# Patient Record
Sex: Female | Born: 1965 | Race: Black or African American | Hispanic: No | Marital: Single | Smoking: Never smoker
Health system: Southern US, Community
[De-identification: ages and names within clinical notes are randomized; demographics above are authoritative.]

## PROBLEM LIST (undated history)

## (undated) DIAGNOSIS — F1910 Other psychoactive substance abuse, uncomplicated: Secondary | ICD-10-CM

## (undated) DIAGNOSIS — F319 Bipolar disorder, unspecified: Secondary | ICD-10-CM

## (undated) DIAGNOSIS — I10 Essential (primary) hypertension: Secondary | ICD-10-CM

## (undated) DIAGNOSIS — B1920 Unspecified viral hepatitis C without hepatic coma: Secondary | ICD-10-CM

## (undated) DIAGNOSIS — J449 Chronic obstructive pulmonary disease, unspecified: Secondary | ICD-10-CM

## (undated) HISTORY — PX: NO PAST SURGERIES: SHX2092

---

## 2005-02-07 ENCOUNTER — Inpatient Hospital Stay: Payer: Self-pay | Admitting: Unknown Physician Specialty

## 2006-03-12 ENCOUNTER — Emergency Department (HOSPITAL_COMMUNITY): Admission: EM | Admit: 2006-03-12 | Discharge: 2006-03-12 | Payer: Self-pay | Admitting: Emergency Medicine

## 2008-08-27 ENCOUNTER — Other Ambulatory Visit: Admission: RE | Admit: 2008-08-27 | Discharge: 2008-08-27 | Payer: Self-pay | Admitting: Obstetrics and Gynecology

## 2008-12-26 ENCOUNTER — Emergency Department (HOSPITAL_COMMUNITY): Admission: EM | Admit: 2008-12-26 | Discharge: 2008-12-26 | Payer: Self-pay | Admitting: Emergency Medicine

## 2009-04-30 ENCOUNTER — Emergency Department (HOSPITAL_COMMUNITY): Admission: EM | Admit: 2009-04-30 | Discharge: 2009-04-30 | Payer: Self-pay | Admitting: Emergency Medicine

## 2010-04-14 ENCOUNTER — Emergency Department (HOSPITAL_COMMUNITY)
Admission: EM | Admit: 2010-04-14 | Discharge: 2010-04-14 | Disposition: A | Discharge status: Home / Self Care | Payer: Medicaid Other | Attending: Emergency Medicine | Admitting: Emergency Medicine

## 2010-04-14 DIAGNOSIS — H109 Unspecified conjunctivitis: Secondary | ICD-10-CM | POA: Insufficient documentation

## 2010-04-14 DIAGNOSIS — H5789 Other specified disorders of eye and adnexa: Secondary | ICD-10-CM | POA: Insufficient documentation

## 2010-06-23 ENCOUNTER — Other Ambulatory Visit: Payer: Self-pay | Admitting: Obstetrics and Gynecology

## 2010-06-23 ENCOUNTER — Other Ambulatory Visit (HOSPITAL_COMMUNITY)
Admission: RE | Admit: 2010-06-23 | Discharge: 2010-06-23 | Disposition: A | Discharge status: Home / Self Care | Payer: Medicaid Other | Source: Ambulatory Visit | Attending: Obstetrics and Gynecology | Admitting: Obstetrics and Gynecology

## 2010-06-23 DIAGNOSIS — Z01419 Encounter for gynecological examination (general) (routine) without abnormal findings: Secondary | ICD-10-CM | POA: Insufficient documentation

## 2012-09-05 ENCOUNTER — Other Ambulatory Visit: Payer: Self-pay | Admitting: *Deleted

## 2012-09-07 MED ORDER — DILTIAZEM HCL ER COATED BEADS 180 MG PO CP24: 180.0000 mg | ORAL_CAPSULE | Freq: Every day | ORAL | Status: DC

## 2012-12-28 ENCOUNTER — Other Ambulatory Visit: Payer: Self-pay | Admitting: *Deleted

## 2012-12-31 MED ORDER — DILTIAZEM HCL ER COATED BEADS 180 MG PO CP24: 180.0000 mg | ORAL_CAPSULE | Freq: Every day | ORAL | Status: DC

## 2013-04-02 ENCOUNTER — Other Ambulatory Visit: Payer: Self-pay | Admitting: *Deleted

## 2013-04-16 ENCOUNTER — Other Ambulatory Visit: Payer: Self-pay | Admitting: *Deleted

## 2013-05-13 ENCOUNTER — Other Ambulatory Visit: Payer: Self-pay | Admitting: *Deleted

## 2013-05-14 ENCOUNTER — Other Ambulatory Visit: Payer: Self-pay | Admitting: *Deleted

## 2013-05-14 NOTE — Telephone Encounter (Signed)
Pt notified Dr. Emelda FearFerguson would not fill RX for Cardizem without an appt. Call transferred to front staff for an appt to be made.

## 2013-05-20 ENCOUNTER — Other Ambulatory Visit: Payer: Self-pay | Admitting: *Deleted

## 2013-05-20 MED ORDER — DILTIAZEM HCL ER COATED BEADS 180 MG PO TB24
180.0000 mg | ORAL_TABLET | Freq: Every day | ORAL | Status: DC
Start: 2013-05-20 — End: 2015-11-22

## 2013-05-20 NOTE — Telephone Encounter (Signed)
Pt advised that an appt needed to continue meds. She hasnt been seen in 3 + yrs.. Pt agrees to appt.

## 2013-06-17 ENCOUNTER — Other Ambulatory Visit: Payer: Self-pay | Admitting: *Deleted

## 2013-06-27 ENCOUNTER — Other Ambulatory Visit: Payer: Self-pay | Admitting: *Deleted

## 2013-06-28 NOTE — Telephone Encounter (Signed)
Pt informed Dr. Emelda FearFerguson would need a appointment and see Dr. Emelda FearFerguson before he would refill Cardizem request. Pt stated lived in [place] and was not able to make an appt at this time. Pt encouraged if she was unable to come to our office she might need to establish care with a provider closer to were she lives.

## 2013-06-30 ENCOUNTER — Emergency Department: Payer: Self-pay | Admitting: Emergency Medicine

## 2015-02-16 ENCOUNTER — Ambulatory Visit: Payer: Self-pay | Admitting: Family Medicine

## 2015-03-24 ENCOUNTER — Ambulatory Visit: Payer: Self-pay | Admitting: Family Medicine

## 2015-04-28 ENCOUNTER — Ambulatory Visit: Payer: Self-pay | Admitting: Family Medicine

## 2015-11-10 ENCOUNTER — Other Ambulatory Visit: Payer: Self-pay | Admitting: Nurse Practitioner

## 2015-11-10 DIAGNOSIS — Z1231 Encounter for screening mammogram for malignant neoplasm of breast: Secondary | ICD-10-CM

## 2015-11-19 ENCOUNTER — Encounter: Payer: Self-pay | Admitting: Emergency Medicine

## 2015-11-19 ENCOUNTER — Emergency Department
Admission: EM | Admit: 2015-11-19 | Discharge: 2015-11-19 | Disposition: A | Discharge status: Home / Self Care | Payer: Medicaid Other | Attending: Emergency Medicine | Admitting: Emergency Medicine

## 2015-11-19 ENCOUNTER — Emergency Department: Payer: Medicaid Other

## 2015-11-19 DIAGNOSIS — I10 Essential (primary) hypertension: Secondary | ICD-10-CM | POA: Insufficient documentation

## 2015-11-19 DIAGNOSIS — K858 Other acute pancreatitis without necrosis or infection: Secondary | ICD-10-CM | POA: Insufficient documentation

## 2015-11-19 DIAGNOSIS — R1013 Epigastric pain: Secondary | ICD-10-CM | POA: Diagnosis present

## 2015-11-19 DIAGNOSIS — J449 Chronic obstructive pulmonary disease, unspecified: Secondary | ICD-10-CM | POA: Insufficient documentation

## 2015-11-19 DIAGNOSIS — F1029 Alcohol dependence with unspecified alcohol-induced disorder: Secondary | ICD-10-CM | POA: Diagnosis not present

## 2015-11-19 HISTORY — DX: Chronic obstructive pulmonary disease, unspecified: J44.9

## 2015-11-19 LAB — COMPREHENSIVE METABOLIC PANEL
ALK PHOS: 115 U/L (ref 38–126)
ALT: 72 U/L — AB (ref 14–54)
AST: 67 U/L — AB (ref 15–41)
Albumin: 4 g/dL (ref 3.5–5.0)
Anion gap: 4 — ABNORMAL LOW (ref 5–15)
BILIRUBIN TOTAL: 0.4 mg/dL (ref 0.3–1.2)
BUN: 11 mg/dL (ref 6–20)
CALCIUM: 10.4 mg/dL — AB (ref 8.9–10.3)
CO2: 20 mmol/L — ABNORMAL LOW (ref 22–32)
CREATININE: 1.18 mg/dL — AB (ref 0.44–1.00)
Chloride: 107 mmol/L (ref 101–111)
GFR calc Af Amer: >60 mL/min (ref >60)
GFR calc non Af Amer: 52 mL/min — ABNORMAL LOW (ref >60)
GLUCOSE: 113 mg/dL — AB (ref 65–99)
Potassium: 3.9 mmol/L (ref 3.5–5.1)
SODIUM: 131 mmol/L — AB (ref 135–145)
TOTAL PROTEIN: 7.7 g/dL (ref 6.5–8.1)

## 2015-11-19 LAB — URINALYSIS COMPLETE WITH MICROSCOPIC (ARMC ONLY)
BILIRUBIN URINE: NEGATIVE
GLUCOSE, UA: NEGATIVE mg/dL
HGB URINE DIPSTICK: NEGATIVE
KETONES UR: NEGATIVE mg/dL
NITRITE: NEGATIVE
Protein, ur: NEGATIVE mg/dL
Specific Gravity, Urine: 1.008 (ref 1.005–1.030)
pH: 7 (ref 5.0–8.0)

## 2015-11-19 LAB — CBC
HEMATOCRIT: 43.2 % (ref 35.0–47.0)
Hemoglobin: 15.2 g/dL (ref 12.0–16.0)
MCH: 33 pg (ref 26.0–34.0)
MCHC: 35.1 g/dL (ref 32.0–36.0)
MCV: 94.1 fL (ref 80.0–100.0)
PLATELETS: 248 10*3/uL (ref 150–440)
RBC: 4.59 MIL/uL (ref 3.80–5.20)
RDW: 11.8 % (ref 11.5–14.5)
WBC: 8.9 10*3/uL (ref 3.6–11.0)

## 2015-11-19 LAB — LIPASE, BLOOD: LIPASE: 178 U/L — AB (ref 11–51)

## 2015-11-19 MED ORDER — ACETAMINOPHEN 325 MG PO TABS: 650.0000 mg | ORAL_TABLET | Freq: Once | ORAL | Status: DC

## 2015-11-19 MED ORDER — ONDANSETRON 4 MG PO TBDP: ORAL_TABLET | ORAL | 0 refills | Status: DC

## 2015-11-19 MED ORDER — ACETAMINOPHEN 500 MG PO TABS
ORAL_TABLET | ORAL | Status: AC
  Filled 2015-11-19: qty 2

## 2015-11-19 NOTE — Discharge Instructions (Signed)
As we discussed, we believe that your symptoms are the result of alcohol-induced pancreatitis although we cannot be absolutely sure.  You do have gallstones but it does not appear that the gallstones are the cause of the pancreatitis.  We offered to admission to the hospital but he declined at this time.  Please return to the emergency department if he develop new or worsening symptoms that concern you such as being unable to eat or drink.  Please discuss with your PCP and your ACT team the issue of your alcohol dependence and see if they have some additional resources for you.  Read through the included information about pancreatitis and try to avoid fatty, greasy, spicy foods as well as cutting down on your alcohol dependence.

## 2015-11-19 NOTE — ED Notes (Signed)
pts family requested tylenol, ns went to give pt tylenol and pt states she "dont like tylenol it makes me sick" and refused.

## 2015-11-19 NOTE — ED Notes (Signed)
See triage note  Nasal congestion  Cough with some fever for about 1- 2 weeks .Marland Kitchen. Was started on Bactrim DS for URI  And sx's became worse.. Diarrhea  Feeling weak.

## 2015-11-19 NOTE — ED Triage Notes (Signed)
Patient presents to the ED with centralized abdominal pain, diarrhea x 5 or more, and vomiting x 2 today with nausea and vomiting with di.  Patient reports nasal congestion and cough x 1-2 weeks.  Patient reports occasional episodes of shakiness or dizziness.  Patient is alert and oriented x 4 at this time.

## 2015-11-19 NOTE — ED Provider Notes (Signed)
[facility] Emergency Department Provider Note  ____________________________________________   First MD Initiated Contact with Patient 11/19/15 1550     (approximate)  I have reviewed the triage vital signs and the nursing notes.   HISTORY  Chief Complaint Emesis; Diarrhea; Abdominal Pain; Nasal Congestion; and Fever    HPI Laurie Mckay is a 50 y.o. female whose medical history includes psychiatric illness and alcohol dependence who presents for evaluation of about 4 weeks of intermittent epigastric pain with occasional episodes of nausea and vomiting.  She states that it is no worse but it is no better and that is why she came to the emergency department today.  She has an outpatient primary care doctor as well as an ACT teamthat sees her regularly for her to health.  She reports that she was able to eat breakfast this morning and keep it down without any difficulty but still has some persistent nausea.  She reports the symptoms are at times severe but currently mild.  She has been compliant with all her regular medications.  She reports that she drinks several 40-ounce beers a day and has been increasing recently.  Nothing particular makes her symptoms better nor worse.   Past Medical History:  Diagnosis Date  . COPD (chronic obstructive pulmonary disease) (HCC)     There are no active problems to display for this patient.   History reviewed. No pertinent surgical history.  Prior to Admission medications   Medication Sig Start Date End Date Taking? Authorizing Provider  diltiazem (CARDIZEM CD) 180 MG 24 hr capsule Take 1 capsule (180 mg total) by mouth daily. 12/28/12   Tilda Burrow, MD  diltiazem (CARDIZEM LA) 180 MG 24 hr tablet Take 1 tablet (180 mg total) by mouth daily. 05/20/13   Tilda Burrow, MD  ondansetron (ZOFRAN ODT) 4 MG disintegrating tablet Allow 1-2 tablets to dissolve in your mouth every 8 hours as needed for nausea/vomiting  11/19/15   Loleta Rose, MD    Allergies Review of patient's allergies indicates no known allergies.  No family history on file.  Social History Social History  Substance Use Topics  . Smoking status: Never Smoker  . Smokeless tobacco: Never Used  . Alcohol use Yes     Comment: 4- 30's daily    Review of Systems Constitutional: No fever/chills Eyes: No visual changes. ENT: No sore throat. Cardiovascular: Denies chest pain. Respiratory: Denies shortness of breath. Gastrointestinal: +epigastric pain , +N/V Genitourinary: Negative for dysuria. Musculoskeletal: Negative for back pain. Skin: Negative for rash. Neurological: Negative for headaches, focal weakness or numbness.  10-point ROS otherwise negative.  ____________________________________________   PHYSICAL EXAM:  VITAL SIGNS: ED Triage Vitals  Enc Vitals Group     BP 11/19/15 1409 124/87     Pulse Rate 11/19/15 1409 78     Resp 11/19/15 1409 20     Temp 11/19/15 1409 100.1 F (37.8 C)     Temp Source 11/19/15 1409 Oral     SpO2 11/19/15 1409 97 %     Weight 11/19/15 1410 156 lb (70.8 kg)     Height 11/19/15 1410 5\' 2"  (1.575 m)     Head Circumference --      Peak Flow --      Pain Score --      Pain Loc --      Pain Edu? --      Excl. in GC? --     Constitutional: Alert and oriented.  Well appearing and in no acute distress. Eyes: Conjunctivae are normal. PERRL. EOMI. Head: Atraumatic. Nose: No congestion/rhinnorhea. Mouth/Throat: Mucous membranes are moist.  Oropharynx non-erythematous. Neck: No stridor.  No meningeal signs.   Cardiovascular: Normal rate, regular rhythm. Good peripheral circulation. Grossly normal heart sounds. Respiratory: Normal respiratory effort.  No retractions. Lungs CTAB. Gastrointestinal: Soft with mild TTP of the epigastrium. Musculoskeletal: No lower extremity tenderness nor edema. No gross deformities of extremities. Neurologic:  Normal speech and language. No gross focal  neurologic deficits are appreciated.  Skin:  Skin is warm, dry and intact. No rash noted. Psychiatric: Mood and affect are normal. Speech and behavior are normal.  ____________________________________________   LABS (all labs ordered are listed, but only abnormal results are displayed)  Labs Reviewed  LIPASE, BLOOD - Abnormal; Notable for the following:       Result Value   Lipase 178 (*)    All other components within normal limits  COMPREHENSIVE METABOLIC PANEL - Abnormal; Notable for the following:    Sodium 131 (*)    CO2 20 (*)    Glucose, Bld 113 (*)    Creatinine, Ser 1.18 (*)    Calcium 10.4 (*)    AST 67 (*)    ALT 72 (*)    GFR calc non Af Amer 52 (*)    Anion gap 4 (*)    All other components within normal limits  CBC  URINALYSIS COMPLETEWITH MICROSCOPIC ([facility] ONLY)   ____________________________________________  EKG  None - EKG not ordered by ED physician ____________________________________________  RADIOLOGY   [place] Abdomen Limited Ruq  Result Date: 11/19/2015 CLINICAL DATA:  RIGHT upper quadrant epigastric pain for 2 weeks, pancreatitis EXAM: US ABDOMEN LIMITED - RIGHT UPPER QUADRANT COMPARISON:  Non FINDINGS: Gallbladder: Shadowing calculi in gallbladder. Largest measured calculus 16 mm diameter. No gallbladder wall thickening, pericholecystic fluid, or sonographic Murphy sign. Common bile duct: Diameter: 2 mm diameter , normal Liver: Echogenic, likely fatty infiltration, though this can be seen with cirrhosis and certain infiltrative disorders. No focal hepatic mass or nodularity. Hepatopetal portal venous flow. No RIGHT upper quadrant free fluid IMPRESSION: Probable fatty infiltration of liver as above. Cholelithiasis without definite evidence of acute cholecystitis. Electronically Signed   By: Ulyses SouthwardMark  Boles M.D.   On: 11/19/2015 17:08    ____________________________________________   PROCEDURES  Procedure(s) performed:   Procedures   Critical Care  performed: No ____________________________________________   INITIAL IMPRESSION / ASSESSMENT AND PLAN / ED COURSE  Pertinent labs & imaging results that were available during my care of the patient were reviewed by me and considered in my medical decision making (see chart for details).  The patient's findings are consistent with mild pancreatitis given her elevated lipase.  I offered admission but she very much does not want to stay in the hospital and her female companion agrees.  She wants follow-up as an outpatient.  I obtained an ultrasound to make sure she has not have evidence of choledocholithiasis or cholecystitis and even though she does have some gallstones just does not seem to be "gallbladder pancreatitis".  I had my usual and customary pancreatitis and alcoholism discussion with patient and she will follow up closely as an outpatient.  I gave strict return precautions.   ____________________________________________  FINAL CLINICAL IMPRESSION(S) / ED DIAGNOSES  Final diagnoses:  Other pancreatitis  Alcohol dependence with unspecified alcohol-induced disorder (HCC)     MEDICATIONS GIVEN DURING THIS VISIT:  Medications - No data to display   NEW  OUTPATIENT MEDICATIONS STARTED DURING THIS VISIT:  New Prescriptions   ONDANSETRON (ZOFRAN ODT) 4 MG DISINTEGRATING TABLET    Allow 1-2 tablets to dissolve in your mouth every 8 hours as needed for nausea/vomiting    Modified Medications   No medications on file    Discontinued Medications   No medications on file     Note:  This document was prepared using Dragon voice recognition software and may include unintentional dictation errors.    Loleta Rose, MD 11/19/15 1730

## 2015-11-22 ENCOUNTER — Inpatient Hospital Stay
Admission: EM | Admit: 2015-11-22 | Discharge: 2015-11-25 | DRG: 917 | Disposition: A | Discharge status: Home / Self Care | Payer: Medicaid Other | Attending: Internal Medicine | Admitting: Internal Medicine

## 2015-11-22 ENCOUNTER — Emergency Department: Payer: Medicaid Other

## 2015-11-22 DIAGNOSIS — B1920 Unspecified viral hepatitis C without hepatic coma: Secondary | ICD-10-CM | POA: Diagnosis present

## 2015-11-22 DIAGNOSIS — T405X1A Poisoning by cocaine, accidental (unintentional), initial encounter: Principal | ICD-10-CM | POA: Diagnosis present

## 2015-11-22 DIAGNOSIS — B9620 Unspecified Escherichia coli [E. coli] as the cause of diseases classified elsewhere: Secondary | ICD-10-CM | POA: Diagnosis present

## 2015-11-22 DIAGNOSIS — R4701 Aphasia: Secondary | ICD-10-CM | POA: Diagnosis present

## 2015-11-22 DIAGNOSIS — Y92009 Unspecified place in unspecified non-institutional (private) residence as the place of occurrence of the external cause: Secondary | ICD-10-CM

## 2015-11-22 DIAGNOSIS — F1010 Alcohol abuse, uncomplicated: Secondary | ICD-10-CM | POA: Diagnosis present

## 2015-11-22 DIAGNOSIS — Z8249 Family history of ischemic heart disease and other diseases of the circulatory system: Secondary | ICD-10-CM

## 2015-11-22 DIAGNOSIS — K859 Acute pancreatitis without necrosis or infection, unspecified: Secondary | ICD-10-CM | POA: Diagnosis present

## 2015-11-22 DIAGNOSIS — G92 Toxic encephalopathy: Secondary | ICD-10-CM | POA: Diagnosis present

## 2015-11-22 DIAGNOSIS — F17200 Nicotine dependence, unspecified, uncomplicated: Secondary | ICD-10-CM | POA: Diagnosis present

## 2015-11-22 DIAGNOSIS — R1013 Epigastric pain: Secondary | ICD-10-CM | POA: Diagnosis present

## 2015-11-22 DIAGNOSIS — F319 Bipolar disorder, unspecified: Secondary | ICD-10-CM | POA: Diagnosis present

## 2015-11-22 DIAGNOSIS — G9340 Encephalopathy, unspecified: Secondary | ICD-10-CM | POA: Diagnosis present

## 2015-11-22 DIAGNOSIS — K861 Other chronic pancreatitis: Secondary | ICD-10-CM

## 2015-11-22 DIAGNOSIS — J449 Chronic obstructive pulmonary disease, unspecified: Secondary | ICD-10-CM | POA: Diagnosis present

## 2015-11-22 DIAGNOSIS — N390 Urinary tract infection, site not specified: Secondary | ICD-10-CM | POA: Diagnosis present

## 2015-11-22 DIAGNOSIS — F1410 Cocaine abuse, uncomplicated: Secondary | ICD-10-CM | POA: Diagnosis present

## 2015-11-22 DIAGNOSIS — R109 Unspecified abdominal pain: Secondary | ICD-10-CM

## 2015-11-22 DIAGNOSIS — I10 Essential (primary) hypertension: Secondary | ICD-10-CM | POA: Diagnosis present

## 2015-11-22 DIAGNOSIS — F1910 Other psychoactive substance abuse, uncomplicated: Secondary | ICD-10-CM | POA: Diagnosis present

## 2015-11-22 DIAGNOSIS — R7889 Finding of other specified substances, not normally found in blood: Secondary | ICD-10-CM | POA: Diagnosis present

## 2015-11-22 HISTORY — DX: Other psychoactive substance abuse, uncomplicated: F19.10

## 2015-11-22 HISTORY — DX: Unspecified viral hepatitis C without hepatic coma: B19.20

## 2015-11-22 HISTORY — DX: Essential (primary) hypertension: I10

## 2015-11-22 HISTORY — DX: Bipolar disorder, unspecified: F31.9

## 2015-11-22 LAB — CBC
HCT: 43.6 % (ref 35.0–47.0)
Hemoglobin: 14.7 g/dL (ref 12.0–16.0)
MCH: 32.2 pg (ref 26.0–34.0)
MCHC: 33.8 g/dL (ref 32.0–36.0)
MCV: 95.3 fL (ref 80.0–100.0)
PLATELETS: 243 10*3/uL (ref 150–440)
RBC: 4.57 MIL/uL (ref 3.80–5.20)
RDW: 11.9 % (ref 11.5–14.5)
WBC: 11.8 10*3/uL — AB (ref 3.6–11.0)

## 2015-11-22 LAB — URINALYSIS COMPLETE WITH MICROSCOPIC (ARMC ONLY)
BILIRUBIN URINE: NEGATIVE
GLUCOSE, UA: NEGATIVE mg/dL
Ketones, ur: NEGATIVE mg/dL
Nitrite: NEGATIVE
Protein, ur: 30 mg/dL — AB
Specific Gravity, Urine: 1.009 (ref 1.005–1.030)
pH: 7 (ref 5.0–8.0)

## 2015-11-22 LAB — URINE DRUG SCREEN, QUALITATIVE (ARMC ONLY)
AMPHETAMINES, UR SCREEN: NOT DETECTED
Barbiturates, Ur Screen: NOT DETECTED
Benzodiazepine, Ur Scrn: NOT DETECTED
CANNABINOID 50 NG, UR ~~LOC~~: NOT DETECTED
COCAINE METABOLITE, UR ~~LOC~~: POSITIVE — AB
MDMA (ECSTASY) UR SCREEN: NOT DETECTED
METHADONE SCREEN, URINE: NOT DETECTED
Opiate, Ur Screen: NOT DETECTED
Phencyclidine (PCP) Ur S: NOT DETECTED
TRICYCLIC, UR SCREEN: NOT DETECTED

## 2015-11-22 LAB — COMPREHENSIVE METABOLIC PANEL
ALK PHOS: 110 U/L (ref 38–126)
ALT: 66 U/L — AB (ref 14–54)
AST: 52 U/L — AB (ref 15–41)
Albumin: 4.2 g/dL (ref 3.5–5.0)
Anion gap: 5 (ref 5–15)
BILIRUBIN TOTAL: 0.7 mg/dL (ref 0.3–1.2)
BUN: 12 mg/dL (ref 6–20)
CHLORIDE: 106 mmol/L (ref 101–111)
CO2: 20 mmol/L — ABNORMAL LOW (ref 22–32)
CREATININE: 1.04 mg/dL — AB (ref 0.44–1.00)
Calcium: 9.4 mg/dL (ref 8.9–10.3)
GFR calc Af Amer: >60 mL/min (ref >60)
Glucose, Bld: 124 mg/dL — ABNORMAL HIGH (ref 65–99)
Potassium: 4 mmol/L (ref 3.5–5.1)
Sodium: 131 mmol/L — ABNORMAL LOW (ref 135–145)
TOTAL PROTEIN: 8 g/dL (ref 6.5–8.1)

## 2015-11-22 LAB — ETHANOL

## 2015-11-22 LAB — ACETAMINOPHEN LEVEL: Acetaminophen (Tylenol), Serum: <10 ug/mL — ABNORMAL LOW (ref 10–30)

## 2015-11-22 LAB — AMMONIA: Ammonia: 42 umol/L — ABNORMAL HIGH (ref 9–35)

## 2015-11-22 LAB — SALICYLATE LEVEL: Salicylate Lvl: <4 mg/dL (ref 2.8–30.0)

## 2015-11-22 LAB — LIPASE, BLOOD: Lipase: 198 U/L — ABNORMAL HIGH (ref 11–51)

## 2015-11-22 MED ORDER — DEXTROSE 5 % IV SOLN
1.0000 g | Freq: Once | INTRAVENOUS | Status: AC
  Administered 2015-11-22: 1 g via INTRAVENOUS
  Filled 2015-11-22: qty 10

## 2015-11-22 NOTE — ED Triage Notes (Signed)
Patient reports that was seen here 3 days ago and diagnosed with pancreatitis and ED wanted to admit patient, but she refused.  Patient returns today due to slurred speech, abdominal pain and wanting to be admitted for her pancreatitis.

## 2015-11-22 NOTE — ED Notes (Signed)
At end triage patient stiffened up, shook for 2-3 seconds but was easily aroused with verbal stimuli.  Patient remained in wheelchair.  Patient reports she has not had any alcohol in the past 4 days.  States that she has never had detox before.

## 2015-11-22 NOTE — ED Provider Notes (Addendum)
[facility] Emergency Department Provider Note  ____________________________________________   I have reviewed the triage vital signs and the nursing notes.   HISTORY  Chief Complaint Abdominal Pain and Aphasia    HPI Laurie Mckay is a 50 y.o. female who presents today with multiple different complaints. Patient was seen here and diagnosed with pancreatitis recently but went home after declining further admission. Since going home she has not been vomiting she has not had abdominal pain she states. However, the family states that she's been acting bizarrely. Patient does have a history of bipolar disorder and they feel that her bipolar disorders acting up. In addition, patient is a daily drinker has not had any alcohol in 4 days. Patient was laughing inappropriately and acting strangely at home. She has not had any fever since her isn't here. She denies headache or stiff neck. Patient does admit to cocaine abuse in the family is aware of this they state that she has been having ongoing cocaine but it doesn't sound as if she she is in the last few days to the extent that anyone can determine. She has not had a seizure, she's been somewhat sleepy recently but is unclear if this is because of her cocaine abuse or the sleeping medication that she takes. There's been no overdose or SI or HI.   Past Medical History:  Diagnosis Date  . COPD (chronic obstructive pulmonary disease) (HCC)     There are no active problems to display for this patient.   No past surgical history on file.  Prior to Admission medications   Medication Sig Start Date End Date Taking? Authorizing Provider  diltiazem (CARDIZEM CD) 180 MG 24 hr capsule Take 1 capsule (180 mg total) by mouth daily. Patient not taking: Reported on 11/22/2015 12/28/12   Tilda Burrow, MD  ondansetron (ZOFRAN ODT) 4 MG disintegrating tablet Allow 1-2 tablets to dissolve in your mouth every 8 hours as needed for  nausea/vomiting Patient not taking: Reported on 11/22/2015 11/19/15   Loleta Rose, MD    Allergies Review of patient's allergies indicates no known allergies.  No family history on file.  Social History Social History  Substance Use Topics  . Smoking status: Never Smoker  . Smokeless tobacco: Never Used  . Alcohol use Yes     Comment: 4- 30's daily    Review of Systems Constitutional: No fever/chills Eyes: No visual changes. ENT: No sore throat. No stiff neck no neck pain Cardiovascular: Denies chest pain. Respiratory: Denies shortness of breath. Gastrointestinal:   no vomiting.  No diarrhea.  No constipation. Genitourinary: Negative for dysuria. Musculoskeletal: Negative lower extremity swelling Skin: Negative for rash. Neurological: Negative for severe headaches, focal weakness or numbness. 10-point ROS otherwise negative.  ____________________________________________   PHYSICAL EXAM:  VITAL SIGNS: ED Triage Vitals  Enc Vitals Group     BP 11/22/15 1916 (!) 166/91     Pulse Rate 11/22/15 1916 78     Resp 11/22/15 1916 18     Temp 11/22/15 1916 99.3 F (37.4 C)     Temp Source 11/22/15 1916 Oral     SpO2 11/22/15 1916 98 %     Weight --      Height --      Head Circumference --      Peak Flow --      Pain Score 11/22/15 1917 9     Pain Loc --      Pain Edu? --  Excl. in GC? --     Constitutional: Alert and orientedTo name and place and unsure of the date. Patient is somnolent but easily arousable to verbal stimuli. She is in no acute medical distress.. Eyes: Conjunctivae are normal. PERRL. EOMI. Head: Atraumatic. Nose: No congestion/rhinnorhea. Mouth/Throat: Mucous membranes are moist.  Oropharynx non-erythematous. Neck: No stridor.   Nontender with no meningismus Cardiovascular: Normal rate, regular rhythm. Grossly normal heart sounds.  Good peripheral circulation. Respiratory: Normal respiratory effort.  No retractions. Lungs CTAB. Abdominal: Soft  and nontender. No distention. No guarding no rebound Back:  There is no focal tenderness or step off.  there is no midline tenderness there are no lesions noted. there is no CVA tenderness Musculoskeletal: No lower extremity tenderness, no upper extremity tenderness. No joint effusions, no DVT signs strong distal pulses no edema Neurologic:  Normal speech and language. No gross focal neurologic deficits are appreciated.  Skin:  Skin is warm, dry and intact. No rash noted. Psychiatric: Mood and affect are somewhat bizarre. Speech and behavior are normal.  ____________________________________________   LABS (all labs ordered are listed, but only abnormal results are displayed)  Labs Reviewed  LIPASE, BLOOD - Abnormal; Notable for the following:       Result Value   Lipase 198 (*)    All other components within normal limits  COMPREHENSIVE METABOLIC PANEL - Abnormal; Notable for the following:    Sodium 131 (*)    CO2 20 (*)    Glucose, Bld 124 (*)    Creatinine, Ser 1.04 (*)    AST 52 (*)    ALT 66 (*)    All other components within normal limits  CBC - Abnormal; Notable for the following:    WBC 11.8 (*)    All other components within normal limits  URINALYSIS COMPLETEWITH MICROSCOPIC ([facility] ONLY) - Abnormal; Notable for the following:    Color, Urine YELLOW (*)    APPearance CLOUDY (*)    Hgb urine dipstick 1+ (*)    Protein, ur 30 (*)    Leukocytes, UA 3+ (*)    Bacteria, UA FEW (*)    Squamous Epithelial / LPF 6-30 (*)    All other components within normal limits  AMMONIA - Abnormal; Notable for the following:    Ammonia 42 (*)    All other components within normal limits  URINE DRUG SCREEN, QUALITATIVE ([facility] ONLY) - Abnormal; Notable for the following:    Cocaine Metabolite,Ur  POSITIVE (*)    All other components within normal limits  ACETAMINOPHEN LEVEL - Abnormal; Notable for the following:    Acetaminophen (Tylenol), Serum <10 (*)    All other components within  normal limits  URINE CULTURE  SALICYLATE LEVEL  ETHANOL  LITHIUM LEVEL   ____________________________________________  EKG  I personally interpreted any EKGs ordered by me or triage Late entry ----------------------------------------- 10:57 PM on 12/03/2015 ----------------------------------------- Sinus rhythm 79 bpm normal axis no acute ST elevation or depression LVH noted   ____________________________________________  RADIOLOGY  I reviewed any imaging ordered by me or triage that were performed during my shift and, if possible, patient and/or family made aware of any abnormal findings. ____________________________________________   PROCEDURES  Procedure(s) performed: None  Procedures  Critical Care performed: None  ____________________________________________   INITIAL IMPRESSION / ASSESSMENT AND PLAN / ED COURSE  Pertinent labs & imaging results that were available during my care of the patient were reviewed by me and considered in my medical decision making (see chart  for details).  Abdomen is benign, somewhat bizarre activity. Likely some accommodation of cocaine abuse, alcohol abuse, there is certainly some possibly hepatic encephalopathy with a mildly elevated ammonia and she has urinary tract infection. All this will be too much stress to clear out as an outpatient. We'll give her antibiotics for her urinary tract infection, we will hold off on lactulose pending further assessment as there is some degree of hemolysis, and I will admit her to the hospital for further evaluation. I discussed with the hospitalist and they agree with this plan. Abdomen is benign at this time, there is no evidence of gallstone pancreatitis on prior ultrasound done a few days ago and given the fact that her belly is benign and will forego further imaging. 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 One thing I do not see any evidence of his acute alcohol withdrawal, patient does not have any evidence of tachycardia or tremor. While certainly possible she has some subacute withdrawal symptoms, there is no evidence of unopposed alcohol withdrawal clinically. I'm reluctant to give her benzodiazepines as she is artery sleepy, we'll continue to observe her. There has been no known seizure activity.  Clinical Course   ____________________________________________   FINAL CLINICAL IMPRESSION(S) / ED DIAGNOSES  Final diagnoses:  None      This chart was dictated using voice recognition software.  Despite best efforts to proofread,  errors can occur which can change meaning.      Jeanmarie Plant, MD 11/22/15 1610    Jeanmarie Plant, MD 12/03/15 2258

## 2015-11-23 ENCOUNTER — Encounter: Payer: Self-pay | Admitting: Internal Medicine

## 2015-11-23 DIAGNOSIS — I10 Essential (primary) hypertension: Secondary | ICD-10-CM | POA: Diagnosis present

## 2015-11-23 DIAGNOSIS — Z8249 Family history of ischemic heart disease and other diseases of the circulatory system: Secondary | ICD-10-CM | POA: Diagnosis not present

## 2015-11-23 DIAGNOSIS — N390 Urinary tract infection, site not specified: Secondary | ICD-10-CM | POA: Diagnosis not present

## 2015-11-23 DIAGNOSIS — K859 Acute pancreatitis without necrosis or infection, unspecified: Secondary | ICD-10-CM | POA: Diagnosis present

## 2015-11-23 DIAGNOSIS — R7889 Finding of other specified substances, not normally found in blood: Secondary | ICD-10-CM | POA: Diagnosis present

## 2015-11-23 DIAGNOSIS — B1920 Unspecified viral hepatitis C without hepatic coma: Secondary | ICD-10-CM | POA: Diagnosis present

## 2015-11-23 DIAGNOSIS — F1410 Cocaine abuse, uncomplicated: Secondary | ICD-10-CM | POA: Diagnosis present

## 2015-11-23 DIAGNOSIS — R4701 Aphasia: Secondary | ICD-10-CM | POA: Diagnosis present

## 2015-11-23 DIAGNOSIS — G9340 Encephalopathy, unspecified: Secondary | ICD-10-CM | POA: Diagnosis present

## 2015-11-23 DIAGNOSIS — F319 Bipolar disorder, unspecified: Secondary | ICD-10-CM | POA: Diagnosis present

## 2015-11-23 DIAGNOSIS — R1013 Epigastric pain: Secondary | ICD-10-CM | POA: Diagnosis present

## 2015-11-23 DIAGNOSIS — F17200 Nicotine dependence, unspecified, uncomplicated: Secondary | ICD-10-CM | POA: Diagnosis present

## 2015-11-23 DIAGNOSIS — F1010 Alcohol abuse, uncomplicated: Secondary | ICD-10-CM | POA: Diagnosis present

## 2015-11-23 DIAGNOSIS — G92 Toxic encephalopathy: Secondary | ICD-10-CM | POA: Diagnosis present

## 2015-11-23 DIAGNOSIS — K861 Other chronic pancreatitis: Secondary | ICD-10-CM

## 2015-11-23 DIAGNOSIS — B9620 Unspecified Escherichia coli [E. coli] as the cause of diseases classified elsewhere: Secondary | ICD-10-CM | POA: Diagnosis present

## 2015-11-23 DIAGNOSIS — Y92009 Unspecified place in unspecified non-institutional (private) residence as the place of occurrence of the external cause: Secondary | ICD-10-CM | POA: Diagnosis not present

## 2015-11-23 DIAGNOSIS — J449 Chronic obstructive pulmonary disease, unspecified: Secondary | ICD-10-CM | POA: Diagnosis present

## 2015-11-23 DIAGNOSIS — T405X1A Poisoning by cocaine, accidental (unintentional), initial encounter: Secondary | ICD-10-CM | POA: Diagnosis not present

## 2015-11-23 DIAGNOSIS — F1910 Other psychoactive substance abuse, uncomplicated: Secondary | ICD-10-CM | POA: Diagnosis present

## 2015-11-23 LAB — CBC
HCT: 39.6 % (ref 35.0–47.0)
Hemoglobin: 13.9 g/dL (ref 12.0–16.0)
MCH: 33.2 pg (ref 26.0–34.0)
MCHC: 35 g/dL (ref 32.0–36.0)
MCV: 94.7 fL (ref 80.0–100.0)
PLATELETS: 224 10*3/uL (ref 150–440)
RBC: 4.18 MIL/uL (ref 3.80–5.20)
RDW: 11.7 % (ref 11.5–14.5)
WBC: 12.1 10*3/uL — AB (ref 3.6–11.0)

## 2015-11-23 LAB — BASIC METABOLIC PANEL
Anion gap: 3 — ABNORMAL LOW (ref 5–15)
BUN: 11 mg/dL (ref 6–20)
CHLORIDE: 108 mmol/L (ref 101–111)
CO2: 22 mmol/L (ref 22–32)
CREATININE: 0.85 mg/dL (ref 0.44–1.00)
Calcium: 9.1 mg/dL (ref 8.9–10.3)
GFR calc non Af Amer: >60 mL/min (ref >60)
GLUCOSE: 133 mg/dL — AB (ref 65–99)
Potassium: 3.2 mmol/L — ABNORMAL LOW (ref 3.5–5.1)
Sodium: 133 mmol/L — ABNORMAL LOW (ref 135–145)

## 2015-11-23 LAB — PHOSPHORUS: Phosphorus: 2.7 mg/dL (ref 2.5–4.6)

## 2015-11-23 LAB — MAGNESIUM: Magnesium: 1.8 mg/dL (ref 1.7–2.4)

## 2015-11-23 LAB — LITHIUM LEVEL: LITHIUM LVL: 2.98 mmol/L — AB (ref 0.60–1.20)

## 2015-11-23 MED ORDER — ONDANSETRON HCL 4 MG/2ML IJ SOLN: 4.0000 mg | Freq: Four times a day (QID) | INTRAMUSCULAR | Status: DC | PRN

## 2015-11-23 MED ORDER — FOLIC ACID 1 MG PO TABS
1.0000 mg | ORAL_TABLET | Freq: Every day | ORAL | Status: DC
  Administered 2015-11-23 – 2015-11-25 (×3): 1 mg via ORAL
  Filled 2015-11-23 (×3): qty 1

## 2015-11-23 MED ORDER — ALBUTEROL SULFATE (2.5 MG/3ML) 0.083% IN NEBU: 3.0000 mL | INHALATION_SOLUTION | RESPIRATORY_TRACT | Status: DC | PRN

## 2015-11-23 MED ORDER — SODIUM CHLORIDE 0.9% FLUSH
3.0000 mL | Freq: Two times a day (BID) | INTRAVENOUS | Status: DC
  Administered 2015-11-24: 3 mL via INTRAVENOUS

## 2015-11-23 MED ORDER — SODIUM CHLORIDE 0.9 % IV SOLN
INTRAVENOUS | Status: AC
  Administered 2015-11-23: 02:00:00 via INTRAVENOUS

## 2015-11-23 MED ORDER — ONDANSETRON HCL 4 MG PO TABS: 4.0000 mg | ORAL_TABLET | Freq: Four times a day (QID) | ORAL | Status: DC | PRN

## 2015-11-23 MED ORDER — LORAZEPAM 2 MG PO TABS: 0.0000 mg | ORAL_TABLET | Freq: Two times a day (BID) | ORAL | Status: DC

## 2015-11-23 MED ORDER — LORAZEPAM 1 MG PO TABS: 1.0000 mg | ORAL_TABLET | Freq: Four times a day (QID) | ORAL | Status: DC | PRN

## 2015-11-23 MED ORDER — LORAZEPAM 2 MG PO TABS
0.0000 mg | ORAL_TABLET | Freq: Four times a day (QID) | ORAL | Status: AC
  Administered 2015-11-23: 2 mg via ORAL
  Filled 2015-11-23: qty 2

## 2015-11-23 MED ORDER — ENOXAPARIN SODIUM 40 MG/0.4ML ~~LOC~~ SOLN
40.0000 mg | SUBCUTANEOUS | Status: DC
  Administered 2015-11-23 – 2015-11-24 (×2): 40 mg via SUBCUTANEOUS
  Filled 2015-11-23 (×2): qty 0.4

## 2015-11-23 MED ORDER — ADULT MULTIVITAMIN W/MINERALS CH
1.0000 | ORAL_TABLET | Freq: Every day | ORAL | Status: DC
  Administered 2015-11-23 – 2015-11-25 (×3): 1 via ORAL
  Filled 2015-11-23 (×3): qty 1

## 2015-11-23 MED ORDER — VITAMIN B-1 100 MG PO TABS
100.0000 mg | ORAL_TABLET | Freq: Every day | ORAL | Status: DC
  Administered 2015-11-23 – 2015-11-25 (×2): 100 mg via ORAL
  Filled 2015-11-23 (×2): qty 1

## 2015-11-23 MED ORDER — MORPHINE SULFATE (PF) 2 MG/ML IV SOLN
2.0000 mg | INTRAVENOUS | Status: DC | PRN
  Administered 2015-11-23 (×2): 2 mg via INTRAVENOUS
  Filled 2015-11-23 (×2): qty 1

## 2015-11-23 MED ORDER — THIAMINE HCL 100 MG/ML IJ SOLN
100.0000 mg | Freq: Every day | INTRAMUSCULAR | Status: DC
  Filled 2015-11-23: qty 2

## 2015-11-23 MED ORDER — DEXTROSE 5 % IV SOLN
2.0000 g | INTRAVENOUS | Status: DC
  Administered 2015-11-23 – 2015-11-25 (×3): 2 g via INTRAVENOUS
  Filled 2015-11-23 (×3): qty 2

## 2015-11-23 MED ORDER — LORAZEPAM 2 MG/ML IJ SOLN
1.0000 mg | Freq: Four times a day (QID) | INTRAMUSCULAR | Status: DC | PRN
  Administered 2015-11-24: 1 mg via INTRAVENOUS
  Filled 2015-11-23: qty 1

## 2015-11-23 NOTE — Care Management (Signed)
Admitted from home where she lives with her spouse with c/o abdominal pain, n/v and aphasia. Cocaine positive. HX of ETOH abuse. CIWA protocol. Found to have a UTI. PCP is Vincent GrosHeather Boscia, NP. She is followed by the ACT team regularly. She has Medicaid. Will monitor chart for discharge needs but have not identified needs at this time.

## 2015-11-23 NOTE — Progress Notes (Signed)
[facility] - [place] at [facility]   PATIENT NAME: Laurie Mckay    MRN#:  [id number]  DATE OF BIRTH:  03/30/1965  SUBJECTIVE:  Hospital Day: 0 days Laurie Mckay is a 50 y.o. female presenting with Abdominal Pain and Aphasia .   Overnight events: No acute overnight events, patient was confused Interval Events: Still somewhat confused this morning, family at bedside, patient various complaints most of them nonmedical nature  REVIEW OF SYSTEMS:  CONSTITUTIONAL: No fever, fatigue or weakness.  EYES: No blurred or double vision.  EARS, NOSE, AND THROAT: No tinnitus or ear pain.  RESPIRATORY: No cough, shortness of breath, wheezing or hemoptysis.  CARDIOVASCULAR: No chest pain, orthopnea, edema.  GASTROINTESTINAL: No nausea, vomiting, diarrhea or abdominal pain.  GENITOURINARY: No dysuria, hematuria.  ENDOCRINE: No polyuria, nocturia,  HEMATOLOGY: No anemia, easy bruising or bleeding SKIN: No rash or lesion. MUSCULOSKELETAL: No joint pain or arthritis.   NEUROLOGIC: No tingling, numbness, weakness.  PSYCHIATRY: No anxiety or depression.   DRUG ALLERGIES:  No Known Allergies  VITALS:  Blood pressure 100/61, pulse 63, temperature 98.1 F (36.7 C), temperature source Oral, resp. rate 16, height 5\' 2"  (1.575 m), weight 73 kg (161 lb), SpO2 100 %.  PHYSICAL EXAMINATION:  VITAL SIGNS: Vitals:   11/23/15 0933 11/23/15 1214  BP: 138/82 100/61  Pulse: 73 63  Resp: 16 16  Temp: 98.4 F (36.9 C) 98.1 F (36.7 C)   GENERAL:50 y.o.female currently in no acute distress.  HEAD: Normocephalic, atraumatic.  EYES: Pupils equal, round, reactive to light. Extraocular muscles intact. No scleral icterus.  MOUTH: Moist mucosal membrane. Dentition intact. No abscess noted.  EAR, NOSE, THROAT: Clear without exudates. No external lesions.  NECK: Supple. No thyromegaly. No nodules. No JVD.  PULMONARY: Clear to ascultation, without wheeze rails or rhonci. No use of  accessory muscles, Good respiratory effort. good air entry bilaterally CHEST: Nontender to palpation.  CARDIOVASCULAR: S1 and S2. Regular rate and rhythm. No murmurs, rubs, or gallops. No edema. Pedal pulses 2+ bilaterally.  GASTROINTESTINAL: Soft, nontender, nondistended. No masses. Positive bowel sounds. No hepatosplenomegaly.  MUSCULOSKELETAL: No swelling, clubbing, or edema. Range of motion full in all extremities.  NEUROLOGIC: Cranial nerves II through XII are intact. No gross focal neurological deficits. Sensation intact. Reflexes intact.  SKIN: No ulceration, lesions, rashes, or cyanosis. Skin warm and dry. Turgor intact.  PSYCHIATRIC: Mood, affect agitated The patient is awake, alert and oriented x 2. Insight, judgment poor.      LABORATORY PANEL:   CBC  Recent Labs Lab 11/23/15 0317  WBC 12.1*  HGB 13.9  HCT 39.6  PLT 224   ------------------------------------------------------------------------------------------------------------------  Chemistries   Recent Labs Lab 11/22/15 1939 11/23/15 0317  NA 131* 133*  K 4.0 3.2*  CL 106 108  CO2 20* 22  GLUCOSE 124* 133*  BUN 12 11  CREATININE 1.04* 0.85  CALCIUM 9.4 9.1  MG 1.8  --   AST 52*  --   ALT 66*  --   ALKPHOS 110  --   BILITOT 0.7  --    ------------------------------------------------------------------------------------------------------------------  Cardiac Enzymes No results for input(s): TROPONINI in the last 168 hours. ------------------------------------------------------------------------------------------------------------------  RADIOLOGY:  Ct Head Wo Contrast  Result Date: 11/22/2015 CLINICAL DATA:  Altered mental status with slurred speech and headache. EXAM: CT HEAD WITHOUT CONTRAST TECHNIQUE: Contiguous axial images were obtained from the base of the skull through the vertex without intravenous contrast. COMPARISON:  None. FINDINGS: Brain: No evidence of  acute infarction, hemorrhage,  hydrocephalus, extra-axial collection or mass lesion/mass effect. Vascular: No hyperdense vessel or unexpected calcification. Skull: Normal. Negative for fracture or focal lesion. Sinuses/Orbits: No acute finding. Other: None. IMPRESSION: Negative exam. Electronically Signed   By: Marnee SpringJonathon  Watts M.D.   On: 11/22/2015 20:33    EKG:   Orders placed or performed during the hospital encounter of 11/22/15  . ED EKG  . ED EKG  . EKG 12-Lead  . EKG 12-Lead    ASSESSMENT AND PLAN:   Jetty PeeksCarol Silverthorn is a 50 y.o. female presenting with Abdominal Pain and Aphasia . Admitted 11/22/2015 : Day #: 0 days  1.Metabolic encephalopathy: Multifactorial including drug abuse, UTI 2. Alcohol abuse: CIWA protocol 3. Urinary tract infection: Ceftriaxone and follow culture data and 4. Bipolar: Lithium, lithium level elevated hold for now 5. Pancreatitis: Premedications IV fluids and advance diet as tolerated-patient was seen in emergency department-refused admission but came back to the hospital for the above   All the records are reviewed and case discussed with Care Management/Social Workerr. Management plans discussed with the patient, family and they are in agreement.  CODE STATUS: full TOTAL TIME TAKING CARE OF THIS PATIENT: 28 minutes.   POSSIBLE D/C IN 2-3DAYS, DEPENDING ON CLINICAL CONDITION.   Hower,  Mardi MainlandDavid K M.D on 11/23/2015 at 12:57 PM  Between 7am to 6pm - Pager - [phone]  After 6pm: House Pager: - [phone]  [facility]  Office  [phone]  CC: Primary care physician; Carlean JewsHeather E Boscia, NP

## 2015-11-23 NOTE — Clinical Social Work Note (Signed)
Clinical Social Work Assessment  Patient Details  Name: Laurie Mckay MRN: [id number] Date of Birth: Nov 08, 1965  Date of referral:  11/23/15               Reason for consult:  Substance Use/ETOH Abuse                Permission sought to share information with:    Permission granted to share information::     Name::        Agency::     Relationship::     Contact Information:     Housing/Transportation Living arrangements for the past 2 months:  Single Family Home Source of Information:  Other (Comment Required) (Significant other ) Patient Interpreter Needed:  None Criminal Activity/Legal Involvement Pertinent to Current Situation/Hospitalization:  No - Comment as needed Significant Relationships:  Significant Other Lives with:  Significant Other Do you feel safe going back to the place where you live?    Need for family participation in patient care:  Yes (Comment)  Care giving concerns:  Patient lives in [place] with her [place].    Social Worker assessment / plan:  Holiday representative ([place]) reviewed chart and noted that patient has a history of ETOH abuse. CSW met with patient and her fiance Laurie Mckay was at bedside. Patient was asleep during assessment. Per Derry him and patient live in [place]. Derry reported that patient quit drinking alcohol around 5 days ago and is going through withdrawal. Per Derry patient has been doing better and walked independently to the bathroom today. Per Derry patient has no children and he is her primary support. Per Derry patient has depression and bipolar and is followed by an ACTT Team. Laurie Mckay reported that he can provide 24/7 care for patient. Derry reported no concerns at this time. CSW will continue to follow and assist as needed.  Employment status:  Disabled (Comment on whether or not currently receiving Disability) Insurance information:  Medicaid In [place] PT Recommendations:  Not assessed at this time Information / Referral to  community resources:     Patient/Family's Response to care:  Patient's fiance Derry reported no concerns at this time.   Patient/Family's Understanding of and Emotional Response to Diagnosis, Current Treatment, and Prognosis:  Fiance Laurie Mckay was very pleasant and thanked CSW for visit.   Emotional Assessment Appearance:  Appears stated age Attitude/Demeanor/Rapport:  Unable to Assess Affect (typically observed):  Unable to Assess Orientation:  Oriented to Self, Oriented to Place, Oriented to  Time, Oriented to Situation Alcohol / Substance use:  Alcohol Use Psych involvement (Current and /or in the community):  No (Comment)  Discharge Needs  Concerns to be addressed:  Discharge Planning Concerns Readmission within the last 30 days:  No Current discharge risk:  Chronically ill, Substance Abuse Barriers to Discharge:  Continued Medical Work up   [employer], Veronia Beets, LCSW 11/23/2015, 4:18 PM

## 2015-11-23 NOTE — Progress Notes (Signed)
Patient alert but forgetful. Inappropriate laughter during conversation. Unsteady gait with muscle weakness.Frequent commands giving during ambulation to patient . Cardiac mointoring intact. Running SR1HB. IV fluids infusing. Expressed concerns of lithium lab value to MD Sheryle Hailiamond. No new orders given. Nursing will continue to monitor.

## 2015-11-23 NOTE — H&P (Signed)
[place] at [place] NAME: Laurie Mckay    MR#:  [id number]  DATE OF BIRTH:  [birth year]  DATE OF ADMISSION:  11/22/2015  PRIMARY CARE PHYSICIAN: Ronnell Freshwater, NP   REQUESTING/REFERRING PHYSICIAN: Burlene Arnt, MD  CHIEF COMPLAINT:   Chief Complaint  Patient presents with  . Abdominal Pain  . Aphasia    HISTORY OF PRESENT ILLNESS:  Laurie Mckay  is a 50 y.o. female who presents with A myriad of complaints.  She has a history of chronic pancreatitis, and comes in complaining of epigastric abdominal pain. She also complains of some dysuria. Family states that she has been acting confused. In the ED she was found to have a lipase of 198 and a UTI. She was also found to be cocaine positive on her urine tox screen. She has a history of significant alcohol use, but states she has not had anything to drink past few days. Hospitals were called for admission and further treatment  PAST MEDICAL HISTORY:   Past Medical History:  Diagnosis Date  . Bipolar 1 disorder ([place])   . COPD (chronic obstructive pulmonary disease) ([place])   . Hepatitis C   . HTN (hypertension)   . Substance abuse     PAST SURGICAL HISTORY:   Past Surgical History:  Procedure Laterality Date  . NO PAST SURGERIES      SOCIAL HISTORY:   Social History  Substance Use Topics  . Smoking status: Never Smoker  . Smokeless tobacco: Never Used  . Alcohol use Yes     Comment: 4- 30's daily    FAMILY HISTORY:   Family History  Problem Relation Age of Onset  . Hypertension Mother     DRUG ALLERGIES:  No Known Allergies  MEDICATIONS AT HOME:   Prior to Admission medications   Medication Sig Start Date End Date Taking? Authorizing Provider  diltiazem (CARDIZEM CD) 180 MG 24 hr capsule Take 1 capsule (180 mg total) by mouth daily. Patient not taking: Reported on 11/22/2015 12/28/12   Jonnie Kind, MD  ondansetron (ZOFRAN ODT) 4 MG disintegrating tablet  Allow 1-2 tablets to dissolve in your mouth every 8 hours as needed for nausea/vomiting Patient not taking: Reported on 11/22/2015 11/19/15   Hinda Kehr, MD    REVIEW OF SYSTEMS:  Review of Systems  Constitutional: Negative for chills, fever, malaise/fatigue and weight loss.  HENT: Negative for ear pain, hearing loss and tinnitus.   Eyes: Negative for blurred vision, double vision, pain and redness.  Respiratory: Negative for cough, hemoptysis and shortness of breath.   Cardiovascular: Negative for chest pain, palpitations, orthopnea and leg swelling.  Gastrointestinal: Positive for abdominal pain and nausea. Negative for constipation, diarrhea and vomiting.  Genitourinary: Positive for dysuria. Negative for frequency and hematuria.  Musculoskeletal: Negative for back pain, joint pain and neck pain.  Skin:       No acne, rash, or lesions  Neurological: Negative for dizziness, tremors, focal weakness and weakness.  Endo/Heme/Allergies: Negative for polydipsia. Does not bruise/bleed easily.  Psychiatric/Behavioral: Negative for depression. The patient is not nervous/anxious and does not have insomnia.      VITAL SIGNS:   Vitals:   11/22/15 2017 11/22/15 2100 11/22/15 2144 11/22/15 2252  BP: (!) 159/88 (!) 148/87 (!) 147/96 (!) 188/92  Pulse: 82 74 83 86  Resp: _0 (!) 22  Temp:      TempSrc:      SpO2: 100% 100% 100%  100%   Wt Readings from Last 3 Encounters:  11/19/15 70.8 kg (156 lb)    PHYSICAL EXAMINATION:  Physical Exam  Vitals reviewed. Constitutional: She is oriented to person, place, and time. She appears well-developed and well-nourished. No distress.  HENT:  Head: Normocephalic and atraumatic.  Mouth/Throat: Oropharynx is clear and moist.  Eyes: Conjunctivae and EOM are normal. Pupils are equal, round, and reactive to light. No scleral icterus.  Neck: Normal range of motion. Neck supple. No JVD present. No thyromegaly present.  Cardiovascular: Normal rate,  regular rhythm and intact distal pulses.  Exam reveals no gallop and no friction rub.   No murmur heard. Respiratory: Effort normal and breath sounds normal. No respiratory distress. She has no wheezes. She has no rales.  GI: Soft. Bowel sounds are normal. She exhibits no distension. There is tenderness.  Musculoskeletal: Normal range of motion. She exhibits no edema.  No arthritis, no gout  Lymphadenopathy:    She has no cervical adenopathy.  Neurological: She is alert and oriented to person, place, and time. No cranial nerve deficit.  No dysarthria, no aphasia  Skin: Skin is warm and dry. No rash noted. No erythema.  Psychiatric: She has a normal mood and affect. Her behavior is normal. Judgment and thought content normal.    LABORATORY PANEL:   CBC  Recent Labs Lab 11/22/15 1939  WBC 11.8*  HGB 14.7  HCT 43.6  PLT 243   ------------------------------------------------------------------------------------------------------------------  Chemistries   Recent Labs Lab 11/22/15 1939  NA 131*  K 4.0  CL 106  CO2 20*  GLUCOSE 124*  BUN 12  CREATININE 1.04*  CALCIUM 9.4  AST 52*  ALT 66*  ALKPHOS 110  BILITOT 0.7   ------------------------------------------------------------------------------------------------------------------  Cardiac Enzymes No results for input(s): TROPONINI in the last 168 hours. ------------------------------------------------------------------------------------------------------------------  RADIOLOGY:  Ct Head Wo Contrast  Result Date: 11/22/2015 CLINICAL DATA:  Altered mental status with slurred speech and headache. EXAM: CT HEAD WITHOUT CONTRAST TECHNIQUE: Contiguous axial images were obtained from the base of the skull through the vertex without intravenous contrast. COMPARISON:  None. FINDINGS: Brain: No evidence of acute infarction, hemorrhage, hydrocephalus, extra-axial collection or mass lesion/mass effect. Vascular: No hyperdense  vessel or unexpected calcification. Skull: Normal. Negative for fracture or focal lesion. Sinuses/Orbits: No acute finding. Other: None. IMPRESSION: Negative exam. Electronically Signed   By: Monte Fantasia M.D.   On: 11/22/2015 20:33    EKG:   Orders placed or performed during the hospital encounter of 11/22/15  . ED EKG  . ED EKG  . EKG 12-Lead  . EKG 12-Lead    IMPRESSION AND PLAN:  Principal Problem:   Acute encephalopathy - likely due to multiple factors, possibly withdrawal, UTI cocaine use. Expect this will improve with treatment of the same. CT head was normal. Active Problems:   UTI (lower urinary tract infection) - IV antibiotics started in the ED and continued on admission, cultures sent from the ED.   Substance abuse - cocaine positive on her urine drug screen today, significant alcohol use though she has not had anything to drink for the past couple of days. CIWA protocol ordered   HTN (hypertension) - currently stable, continue home meds   COPD (chronic obstructive pulmonary disease) ([place]) - continue home inhaler   Bipolar 1 disorder ([place]) - patient's family states that she takes lithium, this is not the standard on her home meds. Either way checking a lithium level.  All the records are reviewed and case  discussed with ED provider. Management plans discussed with the patient and/or family.  DVT PROPHYLAXIS: SubQ lovenox  GI PROPHYLAXIS: None  ADMISSION STATUS: Inpatient  CODE STATUS: Full Code Status History    This patient does not have a recorded code status. Please follow your organizational policy for patients in this situation.      TOTAL TIME TAKING CARE OF THIS PATIENT: 45 minutes.    Wrenna Saks Cunningham 11/23/2015, 12:51 AM  [facility]  Office  [phone]  CC: Primary care physician; Ronnell Freshwater, NP

## 2015-11-23 NOTE — Progress Notes (Signed)
Pharmacy Antibiotic Note  Toma DeitersCarol D Arizpe is a 50 y.o. female admitted on 11/22/2015 with UTI.  Pharmacy has been consulted for ceftriaxone dosing.  Plan: Ceftriaxone 2 grams q 24 hours ordered.  Height: 5\' 2"  (157.5 cm) Weight: 161 lb (73 kg) IBW/kg (Calculated) : 50.1  Temp (24hrs), Avg:98.9 F (37.2 C), Min:98.4 F (36.9 C), Max:99.3 F (37.4 C)   Recent Labs Lab 11/19/15 1416 11/22/15 1939  WBC 8.9 11.8*  CREATININE 1.18* 1.04*    Estimated Creatinine Clearance: 59.9 mL/min (by C-G formula based on SCr of 1.04 mg/dL).    No Known Allergies  Antimicrobials this admission: ceftriaxone  >>    >>   Dose adjustments this admission:   Microbiology results: 9/10 UCx: pending    9/10 UA: LE(+) NO2(-) WBC TNTC  Thank you for allowing pharmacy to be a part of this patient's care.  Lason Eveland S 11/23/2015 2:33 AM

## 2015-11-23 NOTE — ED Notes (Signed)
Meal tray given to patient.

## 2015-11-24 ENCOUNTER — Inpatient Hospital Stay: Payer: Medicaid Other

## 2015-11-24 ENCOUNTER — Encounter: Payer: Self-pay | Admitting: Radiology

## 2015-11-24 LAB — BASIC METABOLIC PANEL
Anion gap: 4 — ABNORMAL LOW (ref 5–15)
BUN: 8 mg/dL (ref 6–20)
CALCIUM: 8.7 mg/dL — AB (ref 8.9–10.3)
CO2: 20 mmol/L — AB (ref 22–32)
Chloride: 111 mmol/L (ref 101–111)
Creatinine, Ser: 0.79 mg/dL (ref 0.44–1.00)
GFR calc Af Amer: >60 mL/min (ref >60)
GLUCOSE: 110 mg/dL — AB (ref 65–99)
Potassium: 3.6 mmol/L (ref 3.5–5.1)
Sodium: 135 mmol/L (ref 135–145)

## 2015-11-24 LAB — MAGNESIUM: Magnesium: 2.1 mg/dL (ref 1.7–2.4)

## 2015-11-24 LAB — LITHIUM LEVEL
LITHIUM LVL: 2.47 mmol/L — AB (ref 0.60–1.20)
Lithium Lvl: 2.22 mmol/L (ref 0.60–1.20)

## 2015-11-24 LAB — AMMONIA: AMMONIA: 35 umol/L (ref 9–35)

## 2015-11-24 MED ORDER — BUTALBITAL-APAP-CAFFEINE 50-325-40 MG PO TABS: 1.0000 | ORAL_TABLET | Freq: Once | ORAL | Status: DC

## 2015-11-24 MED ORDER — TRAMADOL HCL 50 MG PO TABS: 50.0000 mg | ORAL_TABLET | Freq: Four times a day (QID) | ORAL | Status: DC | PRN

## 2015-11-24 MED ORDER — IOPAMIDOL (ISOVUE-300) INJECTION 61%
15.0000 mL | INTRAVENOUS | Status: AC
  Administered 2015-11-24 (×2): 15 mL via ORAL

## 2015-11-24 MED ORDER — CEFUROXIME AXETIL 500 MG PO TABS: 500.0000 mg | ORAL_TABLET | Freq: Two times a day (BID) | ORAL | 0 refills | Status: DC

## 2015-11-24 MED ORDER — ALUM & MAG HYDROXIDE-SIMETH 200-200-20 MG/5ML PO SUSP
30.0000 mL | ORAL | Status: DC | PRN
  Administered 2015-11-24: 30 mL via ORAL
  Filled 2015-11-24: qty 30

## 2015-11-24 MED ORDER — TRAMADOL HCL 50 MG PO TABS: 50.0000 mg | ORAL_TABLET | Freq: Once | ORAL | Status: DC

## 2015-11-24 MED ORDER — SODIUM CHLORIDE 0.9 % IV SOLN: INTRAVENOUS | Status: AC

## 2015-11-24 MED ORDER — IOPAMIDOL (ISOVUE-300) INJECTION 61%
100.0000 mL | Freq: Once | INTRAVENOUS | Status: AC | PRN
  Administered 2015-11-24: 100 mL via INTRAVENOUS

## 2015-11-24 NOTE — Care Management (Signed)
Met with patient at bedside. She states her boyfriend lives with her and cares for her. He is with her 24/7. She is agreeable to a walker. Ordered from [facility]. No needs for home health identified. She does not qualify for PT with Medicaid. No additional needs identified. DC pending today.

## 2015-11-24 NOTE — Progress Notes (Signed)
[facility] - [place] at [facility]   PATIENT NAME: Laurie Mckay    MR#:  [id number]  DATE OF BIRTH:  Mar 22, 1965  SUBJECTIVE:  Plan for discharge but patient with abdominal pain after eating  REVIEW OF SYSTEMS:    Review of Systems  Constitutional: Negative.  Negative for chills, fever and malaise/fatigue.  HENT: Negative.  Negative for ear discharge, ear pain, hearing loss, nosebleeds and sore throat.   Eyes: Negative.  Negative for blurred vision and pain.  Respiratory: Negative.  Negative for cough, hemoptysis, shortness of breath and wheezing.   Cardiovascular: Negative.  Negative for chest pain, palpitations and leg swelling.  Gastrointestinal: Positive for abdominal pain. Negative for blood in stool, diarrhea, nausea and vomiting.  Genitourinary: Negative.  Negative for dysuria.  Musculoskeletal: Negative.  Negative for back pain.  Skin: Negative.   Neurological: Negative for dizziness, tremors, speech change, focal weakness, seizures and headaches.  Endo/Heme/Allergies: Negative.  Does not bruise/bleed easily.  Psychiatric/Behavioral: Negative.  Negative for depression, hallucinations and suicidal ideas.    Tolerating Diet:np      DRUG ALLERGIES:  No Known Allergies  VITALS:  Blood pressure (!) 156/86, pulse 82, temperature 98.4 F (36.9 C), temperature source Oral, resp. rate 16, height 5\' 2"  (1.575 m), weight 73 kg (161 lb), SpO2 98 %.  PHYSICAL EXAMINATION:   Physical Exam  Constitutional: She is oriented to person, place, and time and well-developed, well-nourished, and in no distress. No distress.  HENT:  Head: Normocephalic.  Eyes: No scleral icterus.  Neck: Normal range of motion. Neck supple. No JVD present. No tracheal deviation present.  Cardiovascular: Normal rate, regular rhythm and normal heart sounds.  Exam reveals no gallop and no friction rub.   No murmur heard. Pulmonary/Chest: Effort normal and breath sounds normal. No  respiratory distress. She has no wheezes. She has no rales. She exhibits no tenderness.  Abdominal: Soft. Bowel sounds are normal. She exhibits no distension and no mass. There is no tenderness. There is no rebound and no guarding.  Musculoskeletal: Normal range of motion. She exhibits no edema.  Neurological: She is alert and oriented to person, place, and time.  Skin: Skin is warm. No rash noted. No erythema.  Psychiatric: Affect and judgment normal.      LABORATORY PANEL:   CBC  Recent Labs Lab 11/23/15 0317  WBC 12.1*  HGB 13.9  HCT 39.6  PLT 224   ------------------------------------------------------------------------------------------------------------------  Chemistries   Recent Labs Lab 11/22/15 1939  11/24/15 0814  NA 131*  < > 135  K 4.0  < > 3.6  CL 106  < > 111  CO2 20*  < > 20*  GLUCOSE 124*  < > 110*  BUN 12  < > 8  CREATININE 1.04*  < > 0.79  CALCIUM 9.4  < > 8.7*  MG 1.8  --  2.1  AST 52*  --   --   ALT 66*  --   --   ALKPHOS 110  --   --   BILITOT 0.7  --   --   < > = values in this interval not displayed. ------------------------------------------------------------------------------------------------------------------  Cardiac Enzymes No results for input(s): TROPONINI in the last 168 hours. ------------------------------------------------------------------------------------------------------------------  RADIOLOGY:  Dg Abd 1 View  Result Date: 11/24/2015 CLINICAL DATA:  History of epigastric abdominal pain, dysuria. History of chronic pancreatitis, substance abuse. Known gallstones EXAM: ABDOMEN - 1 VIEW COMPARISON:  Abdominal ultrasound of November 19, 2015 FINDINGS: The bowel gas  pattern is normal. There is moderate gaseous distention of the stomach. No abnormal soft tissue calcifications are observed. The lung bases are clear. The bony structures exhibit no acute abnormality. IMPRESSION: Mild gaseous distention of the stomach. Otherwise  unremarkable supine abdominal radiograph. Electronically Signed   By: David  [place] M.D.   On: 11/24/2015 09:55   Ct Head Wo Contrast  Result Date: 11/22/2015 CLINICAL DATA:  Altered mental status with slurred speech and headache. EXAM: CT HEAD WITHOUT CONTRAST TECHNIQUE: Contiguous axial images were obtained from the base of the skull through the vertex without intravenous contrast. COMPARISON:  None. FINDINGS: Brain: No evidence of acute infarction, hemorrhage, hydrocephalus, extra-axial collection or mass lesion/mass effect. Vascular: No hyperdense vessel or unexpected calcification. Skull: Normal. Negative for fracture or focal lesion. Sinuses/Orbits: No acute finding. Other: None. IMPRESSION: Negative exam. Electronically Signed   By: Marnee Spring M.D.   On: 11/22/2015 20:33     ASSESSMENT AND PLAN:   50 year old female with a history of EtOH abuse who presented with metabolic encephalopathy due to EtOH and UTI.  1. Metabolic encephalopathy: This is due to drug abuse and UTI. Her mental status is not baseline.  2. EtOH abuse: Patient was on CIWA protocol without any eventful detox. She is encouraged to remain abstinent.  3. Urinary tract infection: Patient was started on ceftriaxone. She was afebrile. She will be discharged on Ceftin. At the time of discharge urine culture is pending.  3. History of bipolar 1 disorder: Due to elevated lithium level this has been discontinued for now. [facility] Psych consult.   4. Tobacco dependence: Patient is encouraged to stop smoking. She was counseled for 3 minutes at the time of discharge to stop smoking. She will continue nicotine patch.  5. History of hypertension: Patient is on Minipress 6. Pancreatitis: still with abdominal pain withnegative KUB Order ct abdomen and NPO     Management plans discussed with the patient's mother is in agreement.  CODE STATUS: full  TOTAL TIME TAKING CARE OF THIS PATIENT: 30 minutes.     POSSIBLE  D/C tomorrow, DEPENDING ON CLINICAL CONDITION.   Dontavious Emily M.D on 11/24/2015 at 1:15 PM  Between 7am to 6pm - Pager - [phone] After 6pm go to [email or web address] - [email or web address]  [facility] [place] [facility]  Office  [phone]  CC: Primary care physician; Carlean Jews, NP  Note: This dictation was prepared with Dragon dictation along with smaller phrase technology. Any transcriptional errors that result from this process are unintentional.

## 2015-11-24 NOTE — Discharge Summary (Addendum)
[employer] [facility] [place] [facility]   PATIENT NAME: Laurie PeeksCarol Mckay    MR#:  [id number]  DATE OF BIRTH:  [birth year]  DATE OF ADMISSION:  11/22/2015 ADMITTING PHYSICIAN: Oralia Manisavid Willis, MD  DATE OF DISCHARGE: 11/25/2015  PRIMARY CARE PHYSICIAN: Carlean JewsHeather E Boscia, NP    ADMISSION DIAGNOSIS:  UTI (lower urinary tract infection) [N39.0] Acute pancreatitis, unspecified pancreatitis type [K85.9]  DISCHARGE DIAGNOSIS:  Principal Problem:   Acute encephalopathy Active Problems:   UTI (lower urinary tract infection)   HTN (hypertension)   COPD (chronic obstructive pulmonary disease) (HCC)   Bipolar 1 disorder (HCC)   Substance abuse   Acute on chronic pancreatitis (HCC)   SECONDARY DIAGNOSIS:   Past Medical History:  Diagnosis Date  . Bipolar 1 disorder (HCC)   . COPD (chronic obstructive pulmonary disease) (HCC)   . Hepatitis C   . HTN (hypertension)   . Substance abuse     HOSPITAL COURSE:   50 year old female with a history of EtOH abuse who presented with metabolic encephalopathy due to EtOH and UTI.  1. Metabolic encephalopathy: This is due to drug abuse ( Positive urine toxicology on admission for cocaine) and Escherichia coli UTI. Her mental status is at baseline.  2. EtOH abuse: Patient was on CIWA protocol without any eventful detox. She is encouraged to remain abstinent.  3. Escherichia coli Urinary tract infection: Patient was started on ceftriaxone. Escherichia coli urine culture was sensitive to ceftriaxone sensitive therefore she will be discharged on Ceftin.   3. History of bipolar 1 disorder: Due to elevated lithium level this has been discontinued for now. She may restart this likely on Friday. She will have follow-up with her primary care physician on Friday for lithium level check and if this is within normal limits she may resume lithium. She will continue other outpatient occasions.  4. Tobacco dependence: Patient is encouraged to stop  smoking. She was counseled for 3 minutes at the time of discharge to stop smoking. She will continue nicotine patch.  5. History of hypertension: Patient is on Minipress.  6. Pancreatitis: CT scan confirms mild pancreatitis. Patient was treated with IV fluids and nothing by mouth status. Her abdominal pain improved and she was able to tolerate her diet.  DISCHARGE CONDITIONS AND DIET:   Stable for discharge on regular diet  CONSULTS OBTAINED:  Treatment Team:  Adrian SaranSital Sloan Takagi, MD  DRUG ALLERGIES:  No Known Allergies  DISCHARGE MEDICATIONS:   Current Discharge Medication List    START taking these medications   Details  cefUROXime (CEFTIN) 500 MG tablet Take 1 tablet (500 mg total) by mouth 2 (two) times daily with a meal. Qty: 12 tablet, Refills: 0      CONTINUE these medications which have NOT CHANGED   Details  albuterol (PROVENTIL HFA;VENTOLIN HFA) 108 (90 Base) MCG/ACT inhaler Inhale 1-2 puffs into the lungs 3 (three) times daily as needed for wheezing or shortness of breath.    benztropine (COGENTIN) 1 MG tablet Take 1 mg by mouth daily as needed for tremors.    Cholecalciferol (VITAMIN D3) 2000 units capsule Take 2,000 Units by mouth daily.    gabapentin (NEURONTIN) 300 MG capsule Take 1,200 mg by mouth at bedtime.    hydrOXYzine (VISTARIL) 50 MG capsule Take 50 mg by mouth at bedtime.    Melatonin 5 MG TABS Take 5-10 mg by mouth at bedtime as needed. For insomnia    nicotine (NICODERM CQ - DOSED IN MG/24 HOURS) 21 mg/24hr patch  Place 21 mg onto the skin daily.    paliperidone (INVEGA) 3 MG 24 hr tablet Take 3 mg by mouth every morning.    Paliperidone Palmitate (INVEGA TRINZA) 819 MG/2.625ML SUSP Inject 819 mg into the muscle every 3 (three) months.    prazosin (MINIPRESS) 1 MG capsule Take 1 mg by mouth every morning.      STOP taking these medications     lithium carbonate (ESKALITH) 450 MG CR tablet      lithium carbonate (LITHOBID) 300 MG CR tablet                Today   CHIEF COMPLAINT:   Patient is at baseline. Neysa Bonito is at bedside who takes care of the patient 24/7   VITAL SIGNS:  Blood pressure (!) 150/60, pulse 74, temperature 98.3 F (36.8 C), temperature source Oral, resp. rate 19, height 5\' 2"  (1.575 m), weight 73 kg (161 lb), SpO2 100 %.   REVIEW OF SYSTEMS:  Review of Systems  Constitutional: Negative for chills, fever and malaise/fatigue.  HENT: Negative.  Negative for ear discharge, ear pain, hearing loss, nosebleeds and sore throat.   Eyes: Negative.  Negative for blurred vision and pain.  Respiratory: Negative.  Negative for cough, hemoptysis, shortness of breath and wheezing.   Cardiovascular: Negative.  Negative for chest pain, palpitations and leg swelling.  Gastrointestinal: Negative.  Negative for abdominal pain, blood in stool, diarrhea, nausea and vomiting.  Genitourinary: Negative.  Negative for dysuria.  Musculoskeletal: Negative.  Negative for back pain.  Skin: Negative.   Neurological: Positive for weakness. Negative for dizziness, tremors, speech change, focal weakness, seizures and headaches.  Endo/Heme/Allergies: Negative.  Does not bruise/bleed easily.  Psychiatric/Behavioral: Negative.  Negative for depression, hallucinations and suicidal ideas.     PHYSICAL EXAMINATION:  GENERAL:  50 y.o.-year-old patient lying in the bed with no acute distress.  NECK:  Supple, no jugular venous distention. No thyroid enlargement, no tenderness.  LUNGS: Normal breath sounds bilaterally, no wheezing, rales,rhonchi  No use of accessory muscles of respiration.  CARDIOVASCULAR: S1, S2 normal. No murmurs, rubs, or gallops.  ABDOMEN: Soft, non-tender, non-distended. Bowel sounds present. No organomegaly or mass.  EXTREMITIES: No pedal edema, cyanosis, or clubbing.  PSYCHIATRIC: The patient is alert and oriented x 3. Slow in her speech, poor judgment SKIN: No obvious rash, lesion, or ulcer.   DATA REVIEW:    CBC  Recent Labs Lab 11/23/15 0317  WBC 12.1*  HGB 13.9  HCT 39.6  PLT 224    Chemistries   Recent Labs Lab 11/22/15 1939  11/24/15 0814  11/25/15 0821  NA 131*  < > 135  < > 136  K 4.0  < > 3.6  < > 3.4*  CL 106  < > 111  < > 110  CO2 20*  < > 20*  < > 21*  GLUCOSE 124*  < > 110*  < > 131*  BUN 12  < > 8  < > <5*  CREATININE 1.04*  < > 0.79  < > 0.68  CALCIUM 9.4  < > 8.7*  < > 9.6  MG 1.8  --  2.1  --   --   AST 52*  --   --   --   --   ALT 66*  --   --   --   --   ALKPHOS 110  --   --   --   --   BILITOT 0.7  --   --   --   --   < > =  values in this interval not displayed.  Cardiac Enzymes No results for input(s): TROPONINI in the last 168 hours.  Microbiology Results  @[email or web address] @  RADIOLOGY:  Dg Abd 1 View  Result Date: 11/24/2015 CLINICAL DATA:  History of epigastric abdominal pain, dysuria. History of chronic pancreatitis, substance abuse. Known gallstones EXAM: ABDOMEN - 1 VIEW COMPARISON:  Abdominal ultrasound of November 19, 2015 FINDINGS: The bowel gas pattern is normal. There is moderate gaseous distention of the stomach. No abnormal soft tissue calcifications are observed. The lung bases are clear. The bony structures exhibit no acute abnormality. IMPRESSION: Mild gaseous distention of the stomach. Otherwise unremarkable supine abdominal radiograph. Electronically Signed   By: David  [place] M.D.   On: 11/24/2015 09:55   Ct Abdomen Pelvis W Contrast  Result Date: 11/24/2015 CLINICAL DATA:  Abdominal pain.  Elevated lipase. EXAM: CT ABDOMEN AND PELVIS WITH CONTRAST TECHNIQUE: Multidetector CT imaging of the abdomen and pelvis was performed using the standard protocol following bolus administration of intravenous contrast. CONTRAST:  ISOVUE-300 IOPAMIDOL (ISOVUE-300) INJECTION 61% COMPARISON:  Right upper quadrant abdominal ultrasound 11/19/2015 FINDINGS: Lower chest: Mild motion artifact in the lung bases with subsegmental atelectasis or scarring  and suspected emphysema. No pleural effusion. Hepatobiliary: Diffusely decreased attenuation of the liver is compatible with steatosis. Small stones are again seen in the gallbladder. There is no biliary dilatation or gross gallbladder wall thickening. Pancreas: At most minimal stranding adjacent to the pancreatic body and tail. Normal pancreatic enhancement. No peripancreatic fluid collection. Spleen: Normal in size without focal abnormality. Adrenals/Urinary Tract: Mild thickening of the left adrenal gland. Unremarkable appearance of the right adrenal gland and both kidneys. Unremarkable appearance of the bladder. Stomach/Bowel: The stomach is within normal limits. Oral contrast is present in nondilated loops of small and large bowel to the level of the ascending colon. There is no evidence of bowel obstruction or inflammatory process. The appendix is unremarkable. Vascular/Lymphatic: Mild abdominal aortic atherosclerosis without aneurysm. Periportal lymph nodes measure up to 1.3 cm in short axis, likely reactive. Reproductive: Nodular uterine contour suggestive of fibroids. Other: Small volume pelvic free fluid. No abdominal wall hernia. Small focus of gas in the right upper abdominal wall, likely related to subcutaneous medication injection. Musculoskeletal: No acute osseous abnormality or suspicious osseous lesion. IMPRESSION: 1. At most minimal peripancreatic stranding which may relate to the clinical diagnosis of pancreatitis. No peripancreatic fluid collection. 2. Small volume pelvic free fluid. 3. Suspected uterine fibroids. 4. Hepatic steatosis. 5. Aortic atherosclerosis. 6. Cholelithiasis. Electronically Signed   By: Sebastian Ache M.D.   On: 11/24/2015 20:58      Management plans discussed with the patient and she is in agreement. Stable for discharge home  Patient should follow up with pcp  D/w nurse and patient's mother and fianc  CODE STATUS:     Code Status Orders        Start      Ordered   11/23/15 0204  Full code  Continuous     11/23/15 0203    Code Status History    Date Active Date Inactive Code Status Order ID Comments User Context   This patient has a current code status but no historical code status.      TOTAL TIME TAKING CARE OF THIS PATIENT: 36 minutes.    Note: This dictation was prepared with Dragon dictation along with smaller phrase technology. Any transcriptional errors that result from this process are unintentional.  Yzabelle Calles M.D on 11/25/2015 at 10:43 AM  Between 7am to 6pm - Pager - [phone] After 6pm go to [email or web address] - Social research officer, government  [facility]  Office  [phone]  CC: Primary care physician; Carlean Jews, NP

## 2015-11-24 NOTE — Progress Notes (Signed)
Lab called with a critical Lithium level of 2.47. Dr called no new orders given, will continue to monitor.

## 2015-11-24 NOTE — Evaluation (Signed)
Physical Therapy Evaluation Patient Details Name: Laurie DeitersCarol D Mckay MRN: [id number] DOB: 11/22/1965 Today's Date: 11/24/2015   History of Present Illness  Pt is a 50 y.o. female presenting to hospital with abdominal pain and difficulty speaking 11/22/15.  Pt found to be (+) cocaine.  Pt admitted with acute metabolic encephalopathy and UTI.  Of note, pt in ED 11/19/15 for diarrhea, emesis, abdominal pain, nasal congestion, and fever (pt refused admission for pancreatitis).  PMH includes alcohol dependence, COPD, bipolar disorder, Hep C, htn.  Clinical Impression  Prior to admission, pt was independent ambulating without AD.  Pt reports living with her fiance in 2nd floor apt.  Pt had difficulty describing home set-up and required increased time with all activity including increased time to respond and process cueing.  Currently pt is SBA supine to sit; min assist sit to supine; CGA with transfers and ambulation around nursing loop with RW.  Pt steady with use of RW but requiring cueing for walker use/navigation and for safety (pt would often stop and stare looking slowly around requiring cueing to continue walking).  Pt would benefit from skilled PT to address noted impairments and functional limitations.  Recommend pt discharge to home with 24/7 assist, use of RW, and HHPT when medically appropriate.     Follow Up Recommendations Home health PT;Supervision/Assistance - 24 hour    Equipment Recommendations  Rolling walker with 5" wheels    Recommendations for Other Services       Precautions / Restrictions Precautions Precautions: Fall Restrictions Weight Bearing Restrictions: No      Mobility  Bed Mobility Overal bed mobility: Needs Assistance Bed Mobility: Supine to Sit;Sit to Supine     Supine to sit: Supervision;HOB elevated Sit to supine: Min assist;HOB elevated   General bed mobility comments: supine to sit without additional assist; sit to supine assist for LE's; increased time  for both required  Transfers Overall transfer level: Needs assistance Equipment used: Rolling walker (2 wheeled) Transfers: Sit to/from Stand Sit to Stand: Min guard         General transfer comment: sit to/from stand 2x's from bed and 1x from toilet; occasional vc's for safety required; vc's for walker use required  Ambulation/Gait Ambulation/Gait assistance: Min guard Ambulation Distance (Feet): 200 Feet Assistive device: Rolling walker (2 wheeled)   Gait velocity: overall decreased (occasionally increased)   General Gait Details: pt fluctuating between shuffling gait to taking longer steps requiring intermittent vc's for gait technique; vc's required to stay closer to RW with gait and how to navigate/propel RW; trialed SPC x10 feet but pt unable to safely use it (pt dragging it behind her even with cueing)  Stairs            Wheelchair Mobility    Modified Rankin (Stroke Patients Only)       Balance Overall balance assessment: Needs assistance Sitting-balance support: Bilateral upper extremity supported;Feet supported Sitting balance-Leahy Scale: Good     Standing balance support: Bilateral upper extremity supported (on RW) Standing balance-Leahy Scale: Fair Standing balance comment: static standing with RW                             Pertinent Vitals/Pain Pain Assessment: 0-10 Pain Score: 8  Pain Location: abdomen Pain Descriptors / Indicators: Aching;Tender Pain Intervention(s): Limited activity within patient's tolerance;Monitored during session;Repositioned (RN notified of pt's pain level)  Vitals stable and WFL throughout treatment session (HR and O2)  Home Living Family/patient expects to be discharged to:: Private residence Living Arrangements: Spouse/significant other (Pt's fiance Laurie Mckay) Available Help at Discharge:  (Fiance) Type of Home: Apartment Home Access: Stairs to enter Entrance Stairs-Rails:  (pt did not answer this  question when asked multiple times) Entrance Stairs-Number of Steps: pt inconsistent with # of steps to enter apt but reports apt is on 2nd floor Home Layout: One level Home Equipment: None      Prior Function Level of Independence: Independent         Comments: Pt denies any falls in past 6 months.  Per chart, pt with alcohol dependence.     Hand Dominance        Extremity/Trunk Assessment   Upper Extremity Assessment: Generalized weakness;Difficult to assess due to impaired cognition (Pt with difficulty following commands to test LE's)           Lower Extremity Assessment: Generalized weakness;Difficult to assess due to impaired cognition (Pt with difficulty following commands to test LE's)      Cervical / Trunk Assessment: Normal  Communication   Communication:  (pt sometimes with slurred speach but othertimes normal speech (appeared to fluctuate pending on level of alertness))  Cognition Arousal/Alertness:  (Fluctuating between alert and lethargic) Behavior During Therapy:  (fluctuating between flat affect to spontaneous laughing occasionally) Overall Cognitive Status: No family/caregiver present to determine baseline cognitive functioning (Pt oriented to person but did not answer questions regarding place, time, or situation)       Memory: Decreased recall of precautions              General Comments General comments (skin integrity, edema, etc.): Pt requesting to toilet upon PT entering pt's room.    Exercises  Gait training      Assessment/Plan    PT Assessment Patient needs continued PT services  PT Diagnosis Difficulty walking;Generalized weakness;Acute pain   PT Problem List Decreased strength;Decreased activity tolerance;Decreased balance;Decreased mobility;Decreased knowledge of use of DME;Decreased safety awareness;Decreased knowledge of precautions;Pain  PT Treatment Interventions DME instruction;Gait training;Stair training;Functional  mobility training;Therapeutic activities;Therapeutic exercise;Balance training;Patient/family education   PT Goals (Current goals can be found in the Care Plan section) Acute Rehab PT Goals Patient Stated Goal: to not have slurred speech anymore PT Goal Formulation: With patient Time For Goal Achievement: 12/08/15 Potential to Achieve Goals: Fair    Frequency Min 2X/week   Barriers to discharge   Care management reports pt has 24/7 assist at home    Co-evaluation               End of Session Equipment Utilized During Treatment: Gait belt Activity Tolerance: Patient limited by fatigue Patient left: in bed;with call bell/phone within reach;with bed alarm set Nurse Communication: Mobility status;Precautions (8/10 abdominal pain)         Time: 1019-1040 PT Time Calculation (min) (ACUTE ONLY): 21 min   Charges:   PT Evaluation $PT Eval Low Complexity: 1 Procedure PT Treatments $Gait Training: 8-22 mins   PT G CodesHendricks Limes 12-16-15, 11:12 AM Hendricks Limes, PT [phone]

## 2015-11-24 NOTE — Progress Notes (Signed)
Patient's fiance requested that MD call patient's mother, in regards to [facility]. MD paged, waiting on callback.

## 2015-11-24 NOTE — Progress Notes (Signed)
Critical lab lithium level 2.22, MD called and made aware.

## 2015-11-25 LAB — URINE CULTURE: Culture: >=100000 — AB

## 2015-11-25 LAB — BASIC METABOLIC PANEL
ANION GAP: 6 (ref 5–15)
ANION GAP: 5 (ref 5–15)
CHLORIDE: 111 mmol/L (ref 101–111)
CHLORIDE: 110 mmol/L (ref 101–111)
CO2: 20 mmol/L — ABNORMAL LOW (ref 22–32)
CO2: 21 mmol/L — ABNORMAL LOW (ref 22–32)
Calcium: 9.5 mg/dL (ref 8.9–10.3)
Calcium: 9.6 mg/dL (ref 8.9–10.3)
Creatinine, Ser: 0.68 mg/dL (ref 0.44–1.00)
Creatinine, Ser: 0.68 mg/dL (ref 0.44–1.00)
GFR calc Af Amer: >60 mL/min (ref >60)
GFR calc Af Amer: >60 mL/min (ref >60)
GLUCOSE: 116 mg/dL — AB (ref 65–99)
GLUCOSE: 131 mg/dL — AB (ref 65–99)
POTASSIUM: 3.4 mmol/L — AB (ref 3.5–5.1)
POTASSIUM: 3.4 mmol/L — AB (ref 3.5–5.1)
SODIUM: 136 mmol/L (ref 135–145)
Sodium: 137 mmol/L (ref 135–145)

## 2015-11-25 NOTE — Progress Notes (Signed)
Per pt , she tolerated regular diet and is now requesting to leave.

## 2015-11-25 NOTE — Progress Notes (Signed)
Pt stating that she is "ready to go", I explained to the patient that she will need to eat lunch to make sure she is tolerating her diet. Pt states "she feels wonderful", and wants to leave. MD Mody notified. Orders received to advance diet now if tolerating ok to D/C. Order placed.

## 2015-11-25 NOTE — Care Management (Signed)
Spoke with patient and family briefly to confirm walker delivered to room by agency. New rolling wlaker in room . Family present. Patient alert oriented ans stated that she is "ready to go home". No other needs see previous CM notes.

## 2015-11-25 NOTE — Progress Notes (Signed)
DISCHARGE NOTE:  Pt given discharge instructions. PT verbalized understanding. Pt wheeled to car by staff.

## 2015-12-02 ENCOUNTER — Ambulatory Visit: Payer: Medicaid Other | Attending: Nurse Practitioner

## 2017-02-10 IMAGING — US US ABDOMEN LIMITED
1 series · 14 of 25 positions shown · non-contrast
Comparison: Non

CLINICAL DATA: RIGHT upper quadrant epigastric pain for 2 weeks,
pancreatitis

EXAM:
US ABDOMEN LIMITED - RIGHT UPPER QUADRANT

[Series 1: us abdomen limited · 0.20mm/px · 14 of 54 slices shown]
[im 1/54]
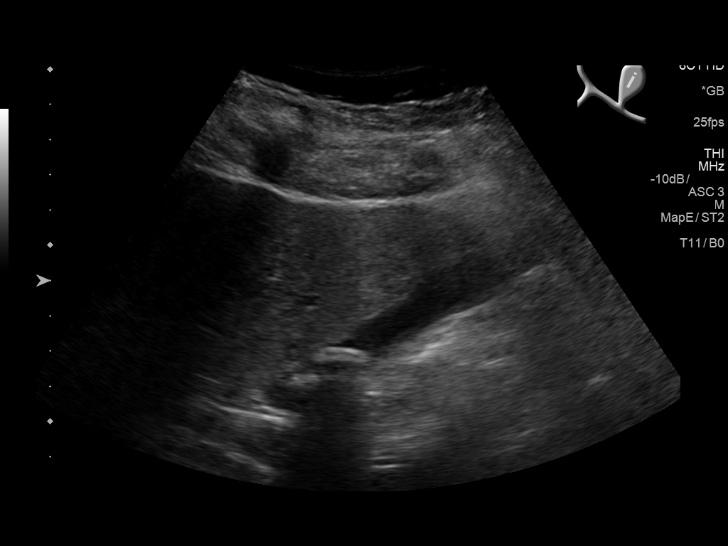
[im 5/54]
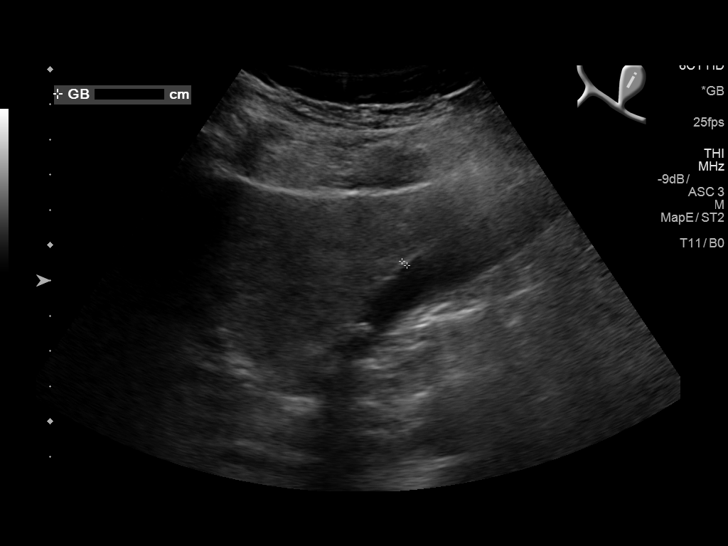
[im 9/54]
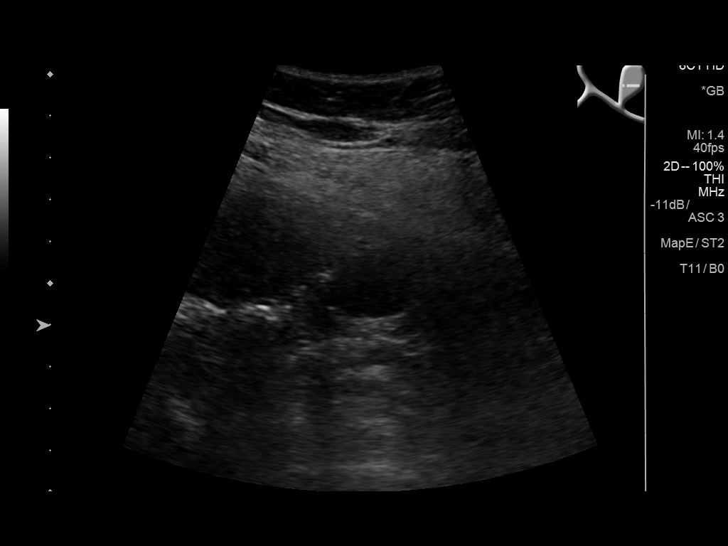
[im 14/54]
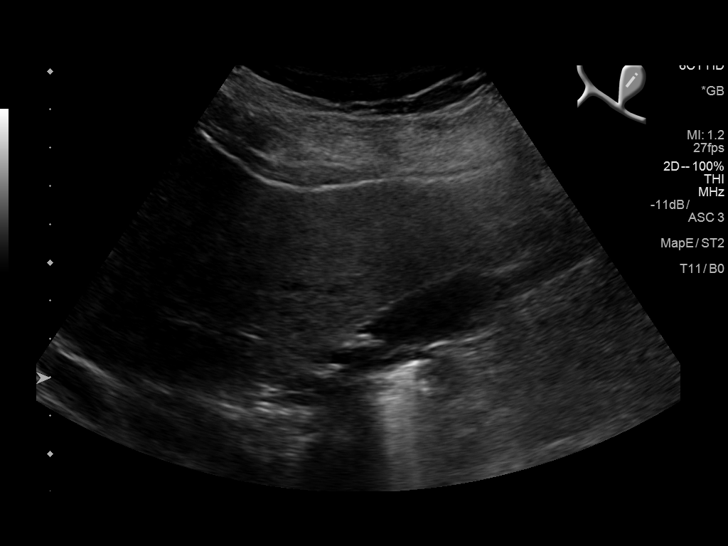
[im 18/54]
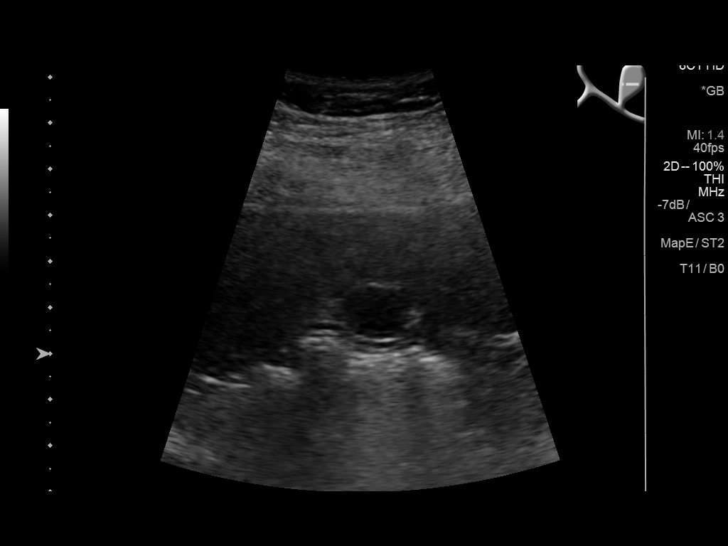
[im 20/54]
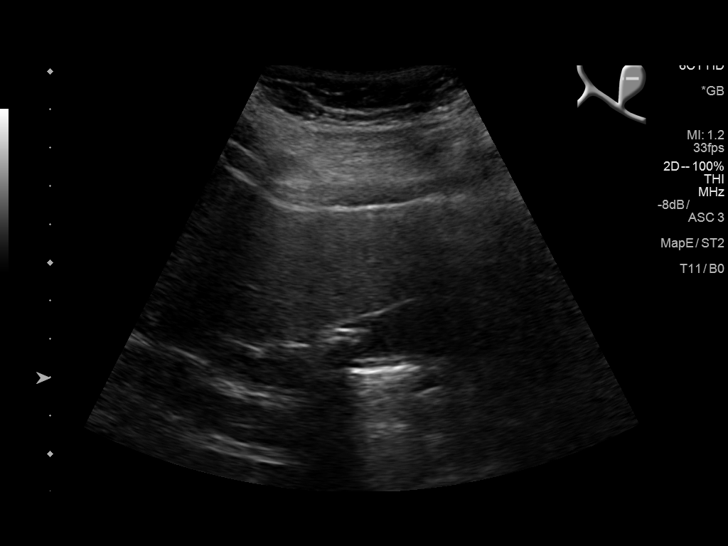
[im 25/54]
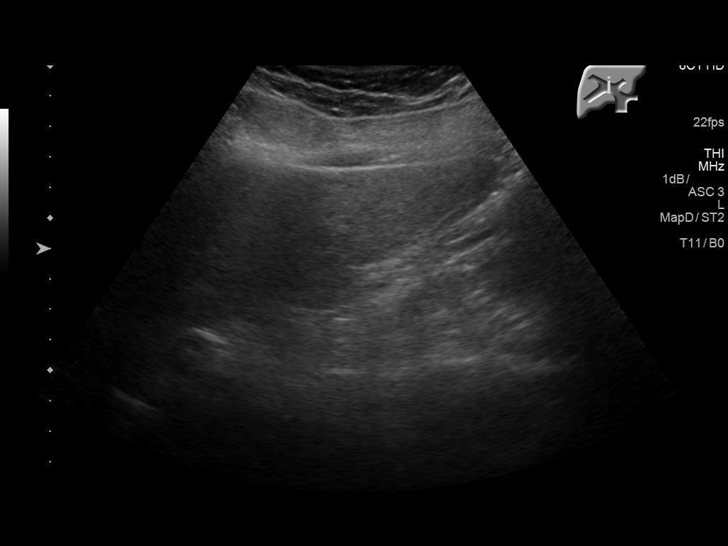
[im 29/54]
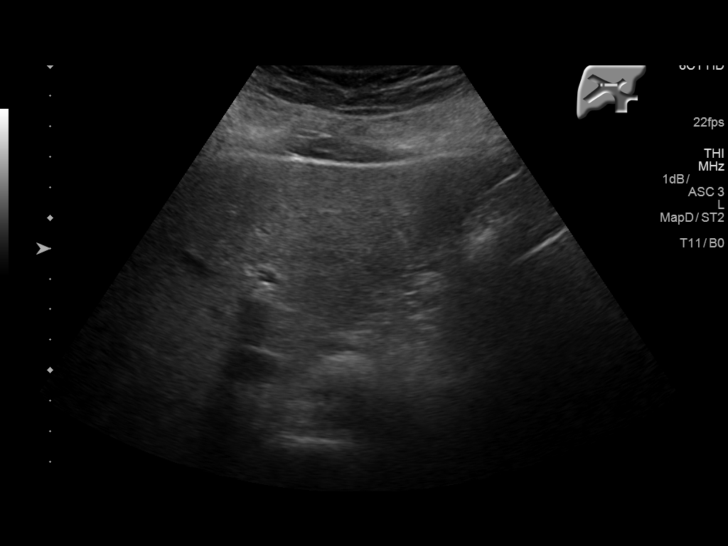
[im 34/54]
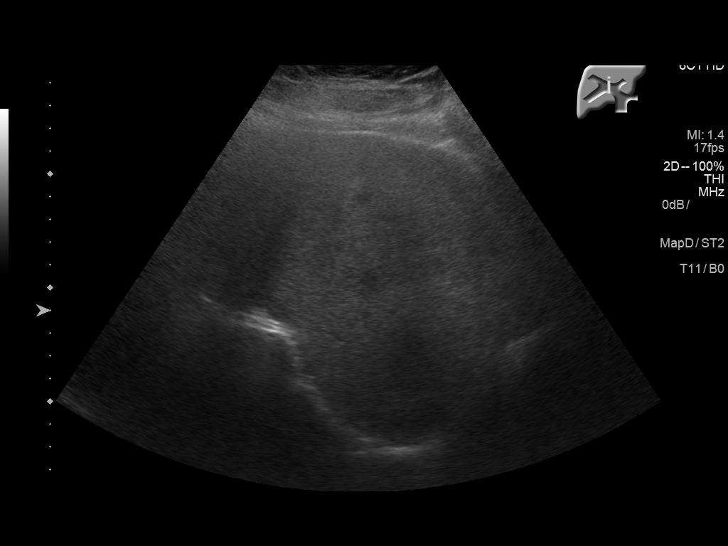
[im 36/54]
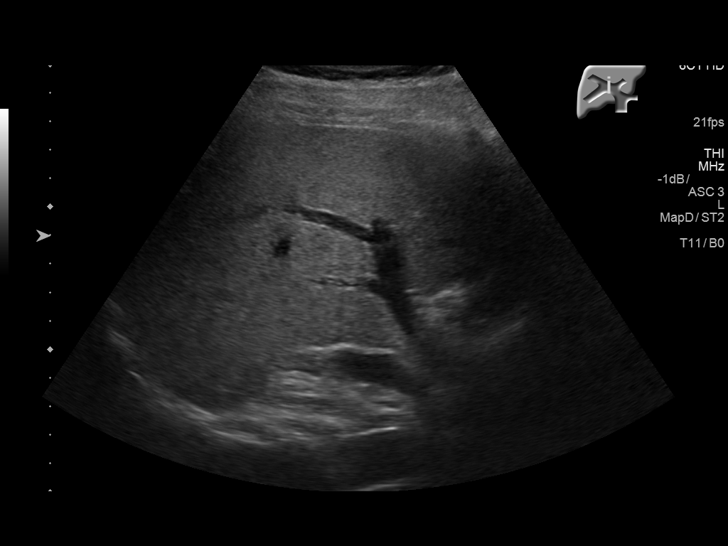
[im 40/54]
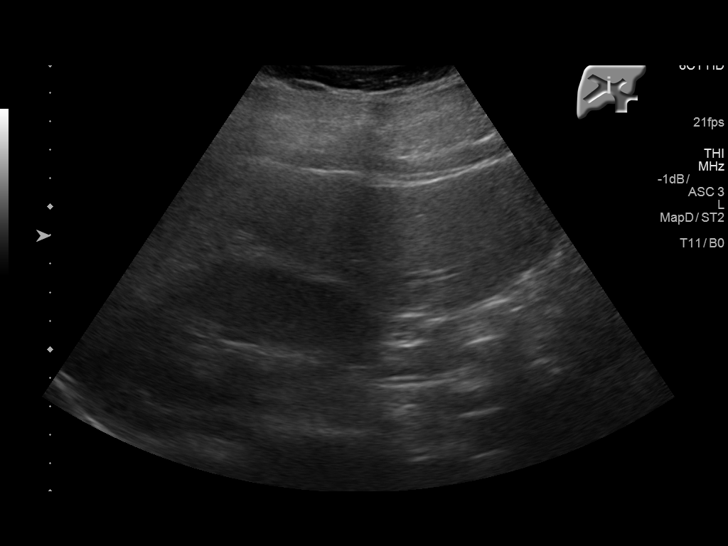
[im 45/54]
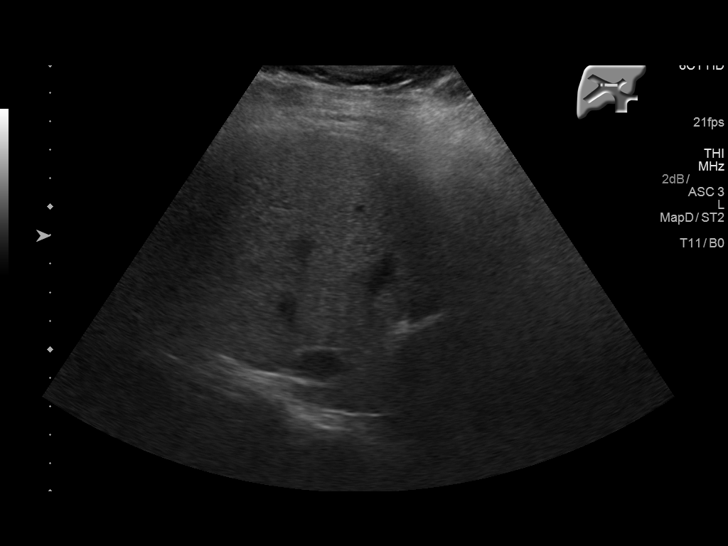
[im 49/54]
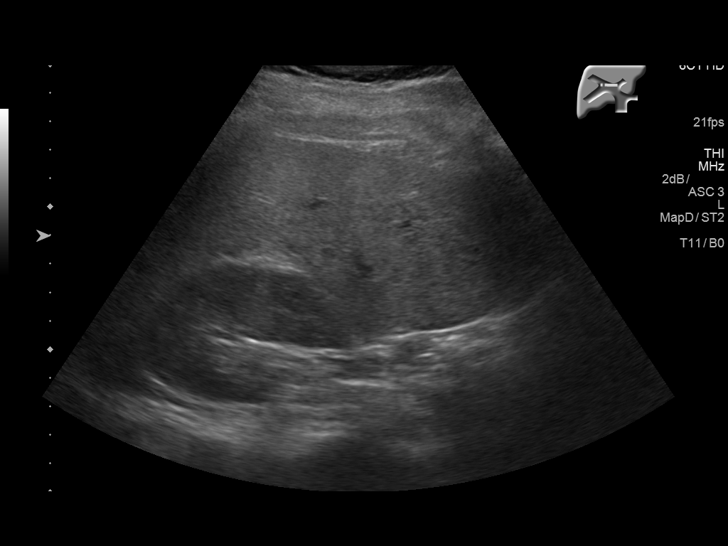
[im 54/54]
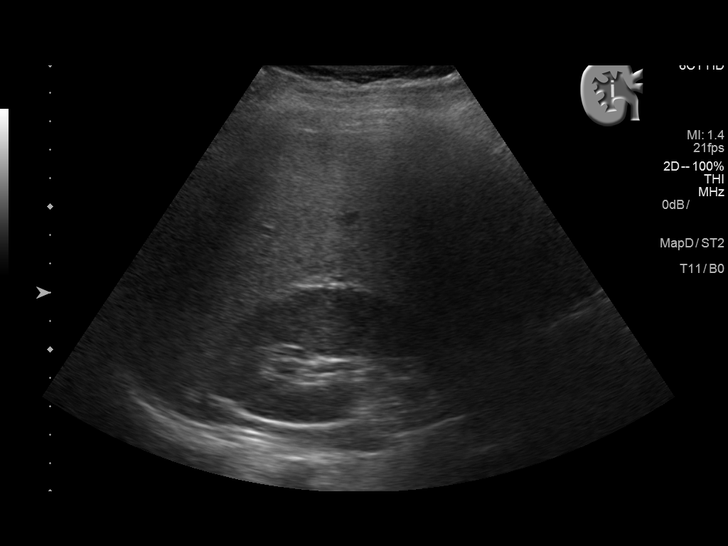

[14 of 25 positions shown; findings below may reference images not displayed]

FINDINGS: Gallbladder:

Shadowing calculi in gallbladder. Largest measured calculus 16 mm
diameter. No gallbladder wall thickening, pericholecystic fluid, or
sonographic Murphy sign.

Common bile duct:

Diameter: 2 mm diameter , normal

Liver:

Echogenic, likely fatty infiltration, though this can be seen with
cirrhosis and certain infiltrative disorders. No focal hepatic mass
or nodularity. Hepatopetal portal venous flow.

No RIGHT upper quadrant free fluid
IMPRESSION: Probable fatty infiltration of liver as above.

Cholelithiasis without definite evidence of acute cholecystitis.

## 2018-02-20 IMAGING — CT CT ABD-PELV W/ CM
2 of 5 series · 15 of 46 positions shown, 17 images · IV contrast (APPLIED)
Comparison: Right upper quadrant abdominal ultrasound 11/19/2015

CLINICAL DATA: Abdominal pain.  Elevated lipase.

EXAM:
CT ABDOMEN AND PELVIS WITH CONTRAST
TECHNIQUE: Multidetector CT imaging of the abdomen and pelvis was performed
using the standard protocol following bolus administration of
intravenous contrast.
CONTRAST:  100mL [id number] IOPAMIDOL ([id number]) INJECTION 61%

[Series 2: axial st · axial · 0.74mm/px · z∈[-1086,-671]mm · 12 of 93 slices shown, 14 images]
[im 5/93  soft-tissue]
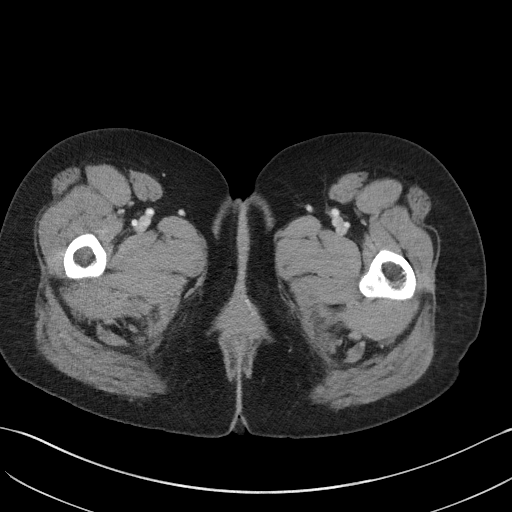
[im 5/93  bone]
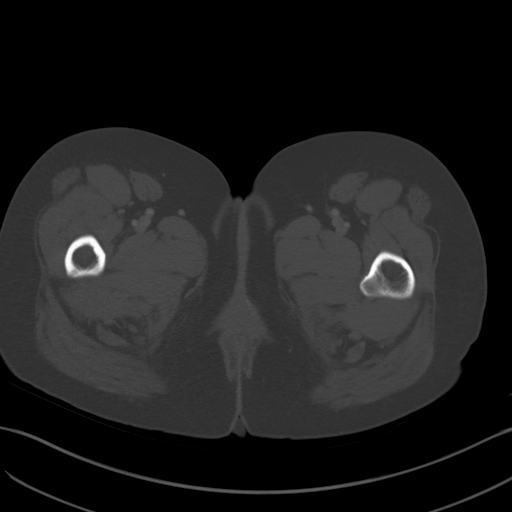
[im 14/93  soft-tissue]
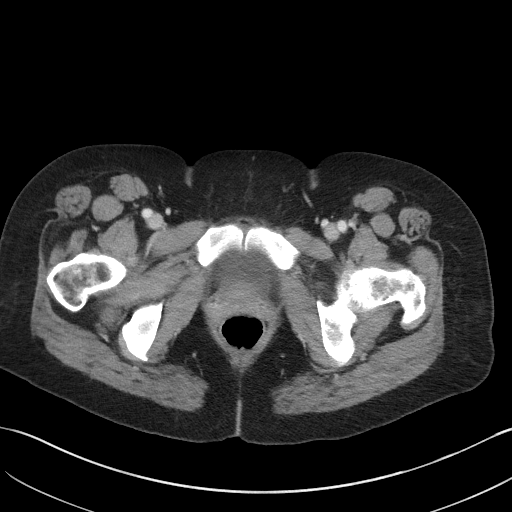
[im 19/93  soft-tissue]
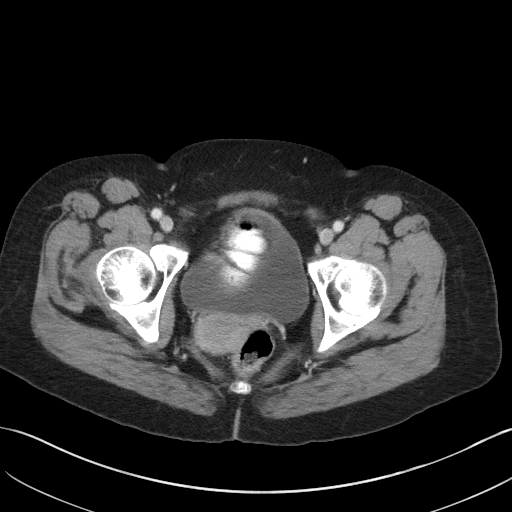
[im 28/93  soft-tissue]
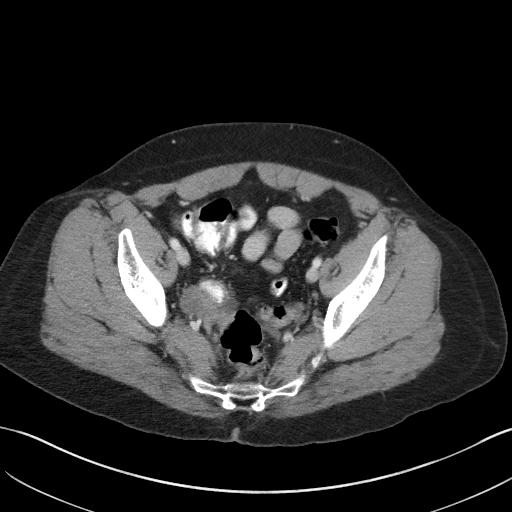
[im 37/93  soft-tissue]
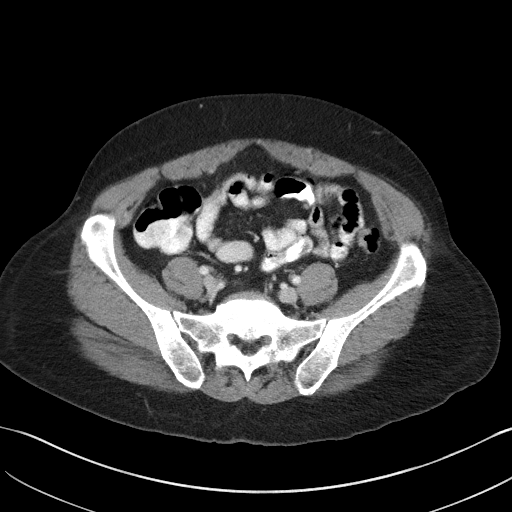
[im 42/93  soft-tissue]
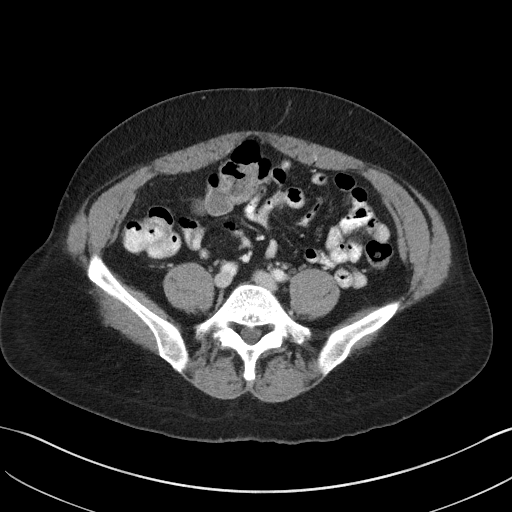
[im 51/93  soft-tissue]
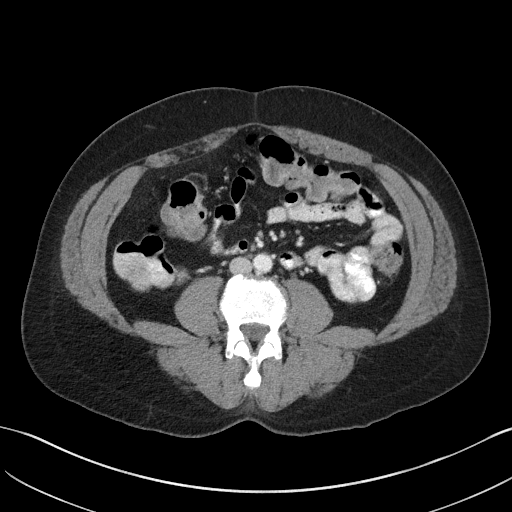
[im 56/93  soft-tissue]
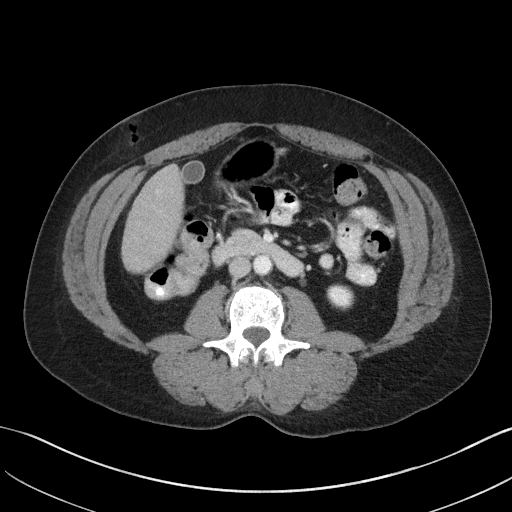
[im 65/93  soft-tissue]
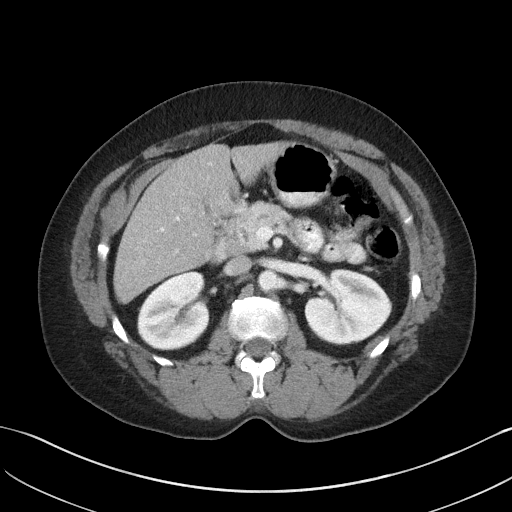
[im 65/93  bone]
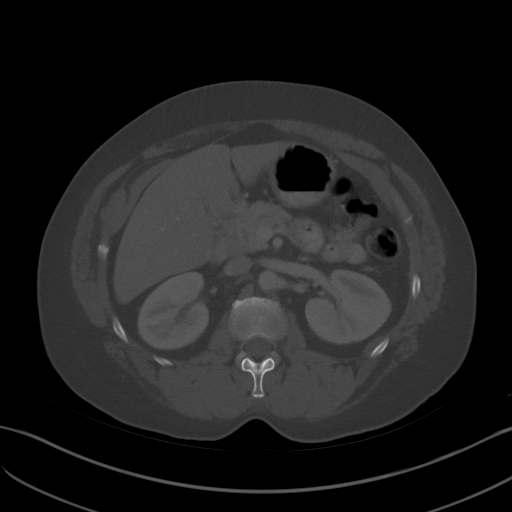
[im 74/93  soft-tissue]
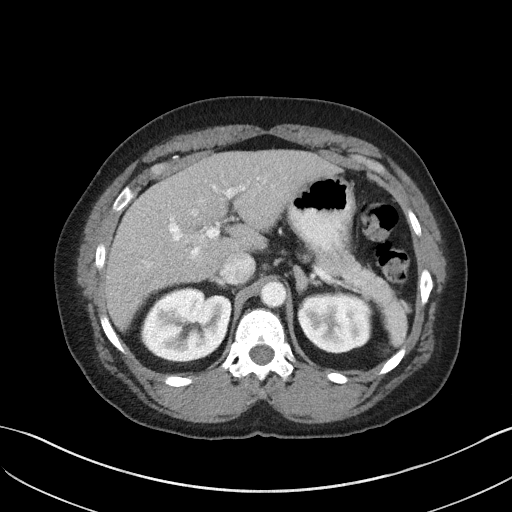
[im 79/93  soft-tissue]
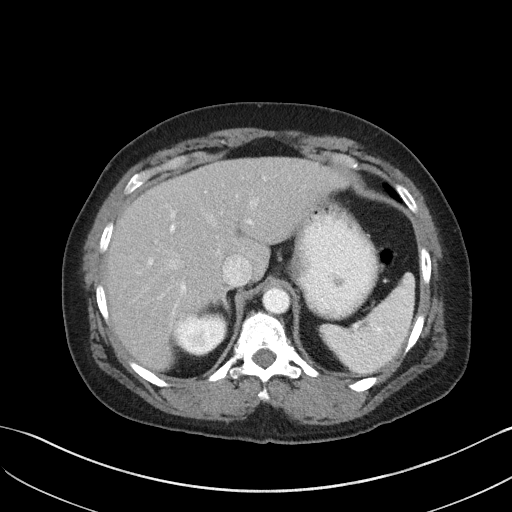
[im 88/93  soft-tissue]
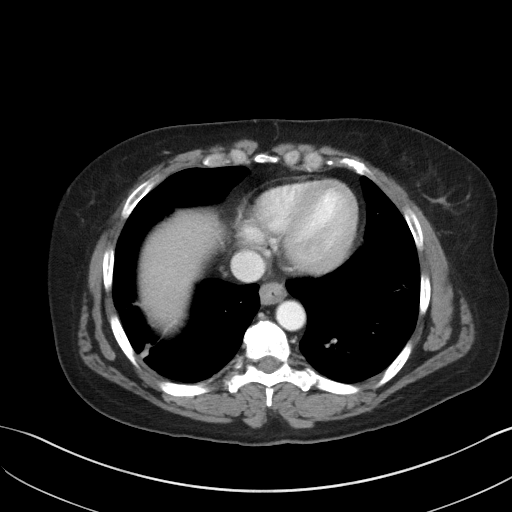

[Series 5: coronal st · coronal · 0.65mm/px · 3 of 89 slices shown]
[im 30/89  soft-tissue]
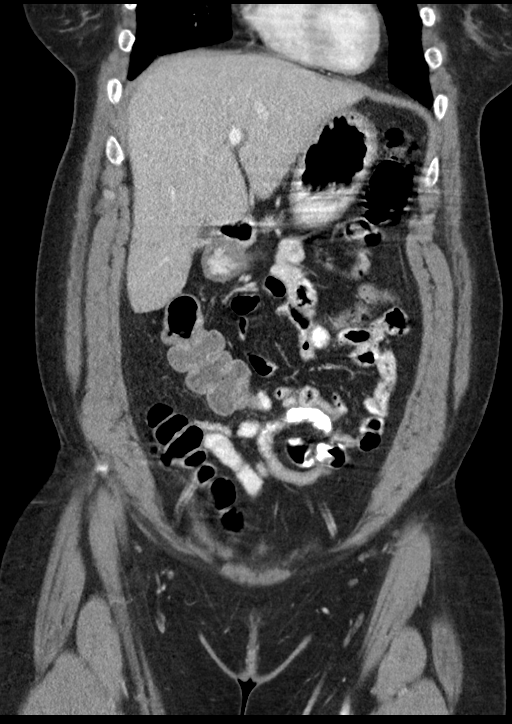
[im 40/89  soft-tissue]
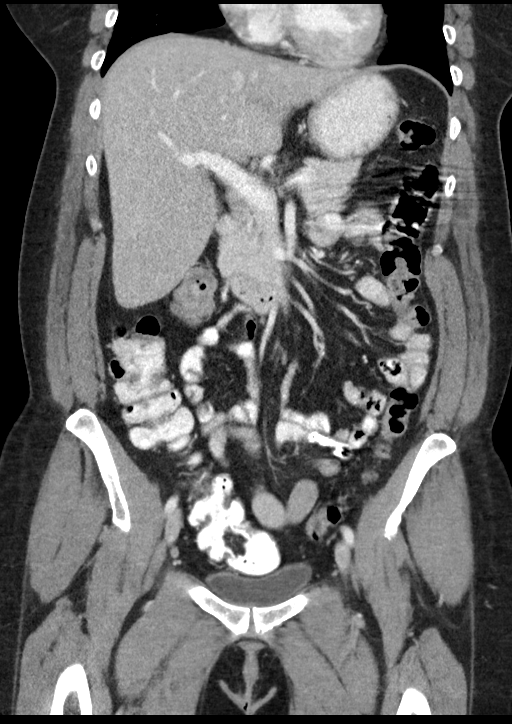
[im 49/89  soft-tissue]
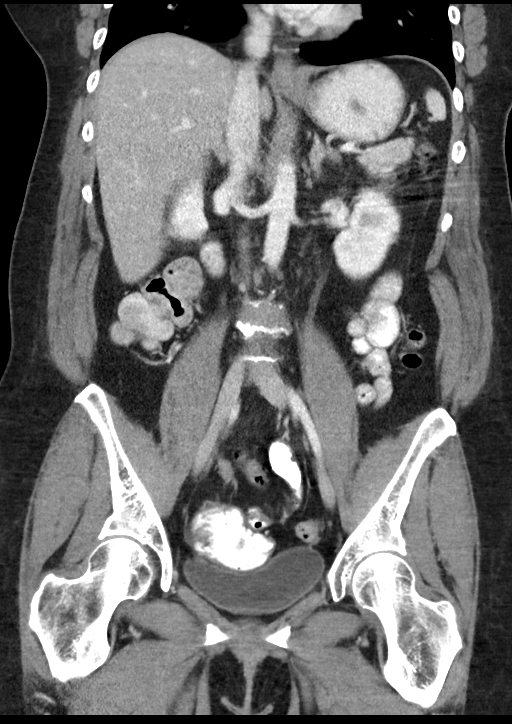

[15 of 46 positions shown; findings below may reference images not displayed]

FINDINGS: Lower chest: Mild motion artifact in the lung bases with
subsegmental atelectasis or scarring and suspected emphysema. No
pleural effusion.

Hepatobiliary: Diffusely decreased attenuation of the liver is
compatible with steatosis. Small stones are again seen in the
gallbladder. There is no biliary dilatation or gross gallbladder
wall thickening.

Pancreas: At most minimal stranding adjacent to the pancreatic body
and tail. Normal pancreatic enhancement. No peripancreatic fluid
collection.

Spleen: Normal in size without focal abnormality.

Adrenals/Urinary Tract: Mild thickening of the left adrenal gland.
Unremarkable appearance of the right adrenal gland and both kidneys.
Unremarkable appearance of the bladder.

Stomach/Bowel: The stomach is within normal limits. Oral contrast is
present in nondilated loops of small and large bowel to the level of
the ascending colon. There is no evidence of bowel obstruction or
inflammatory process. The appendix is unremarkable.

Vascular/Lymphatic: Mild abdominal aortic atherosclerosis without
aneurysm. Periportal lymph nodes measure up to 1.3 cm in short axis,
likely reactive.

Reproductive: Nodular uterine contour suggestive of fibroids.

Other: Small volume pelvic free fluid. No abdominal wall hernia.
Small focus of gas in the right upper abdominal wall, likely related
to subcutaneous medication injection.

Musculoskeletal: No acute osseous abnormality or suspicious osseous
lesion.
IMPRESSION: 1. At most minimal peripancreatic stranding which may relate to the
clinical diagnosis of pancreatitis. No peripancreatic fluid
collection.
2. Small volume pelvic free fluid.
3. Suspected uterine fibroids.
4. Hepatic steatosis.
5. Aortic atherosclerosis.
6. Cholelithiasis.

## 2023-12-07 ENCOUNTER — Encounter (INDEPENDENT_AMBULATORY_CARE_PROVIDER_SITE_OTHER): Payer: Self-pay | Admitting: *Deleted

## 2023-12-19 ENCOUNTER — Ambulatory Visit (INDEPENDENT_AMBULATORY_CARE_PROVIDER_SITE_OTHER): Payer: MEDICAID | Admitting: Gastroenterology

## 2024-01-02 ENCOUNTER — Ambulatory Visit (INDEPENDENT_AMBULATORY_CARE_PROVIDER_SITE_OTHER): Payer: MEDICAID | Admitting: Gastroenterology

## 2024-01-02 ENCOUNTER — Encounter (INDEPENDENT_AMBULATORY_CARE_PROVIDER_SITE_OTHER): Payer: Self-pay | Admitting: Gastroenterology

## 2024-01-02 ENCOUNTER — Telehealth (INDEPENDENT_AMBULATORY_CARE_PROVIDER_SITE_OTHER): Payer: Self-pay

## 2024-01-02 VITALS — BP 161/95 | HR 94 | Temp 97.3°F | Ht 62.0 in | Wt 168.5 lb

## 2024-01-02 DIAGNOSIS — R748 Abnormal levels of other serum enzymes: Secondary | ICD-10-CM | POA: Insufficient documentation

## 2024-01-02 DIAGNOSIS — Z1211 Encounter for screening for malignant neoplasm of colon: Secondary | ICD-10-CM | POA: Diagnosis not present

## 2024-01-02 DIAGNOSIS — R7689 Other specified abnormal immunological findings in serum: Secondary | ICD-10-CM | POA: Insufficient documentation

## 2024-01-02 DIAGNOSIS — I10 Essential (primary) hypertension: Secondary | ICD-10-CM | POA: Diagnosis not present

## 2024-01-02 DIAGNOSIS — F1990 Other psychoactive substance use, unspecified, uncomplicated: Secondary | ICD-10-CM | POA: Insufficient documentation

## 2024-01-02 MED ORDER — PEG 3350-KCL-NA BICARB-NACL 420 G PO SOLR: 4000.0000 mL | Freq: Once | ORAL | 0 refills | Status: AC

## 2024-01-02 NOTE — Telephone Encounter (Signed)
 Spoke with patient, scheduled TCS for 01/18/2024 at 2pm. Rx sent to pharmacy. Instructions mailed.

## 2024-01-02 NOTE — Progress Notes (Signed)
 Shalik Sanfilippo Faizan Arshi Duarte , M.D. Gastroenterology & Hepatology [facility] Gastroenterology [address] [place], [place] [address] Primary Care Physician: Melvenia Sor, NP [address] [place] [address]  Chief Complaint: Hep C antibody positive, elevated liver enzymes, colon cancer screening   History of Present Illness:  Laurie Mckay is a 58 y.o. female with bipolar disorder, substance abuse, COPD, hypertension who presents for evaluation of hep C antibody positive, elevated liver enzymes and screening colonoscopy.  Patient reports she has history of IV drug abuse but has been using this for many years now.  She had blood work done at [facility].  She was told blood work positive for hepatitis C.  Drinks alcohol occasionally as per the patient.The patient denies having any nausea, vomiting, fever, chills, hematochezia, melena, hematemesis, abdominal distention, abdominal pain, diarrhea, jaundice, pruritus or weight loss.  Last ZHI:wnwz Last Colonoscopy:none  Labs from 11/2023 AST 87 ALT 82 alk phos 119 T. bili 0.8 Hep C antibody positive, HIV negative  FHx: neg for any gastrointestinal/liver disease, no malignancies Surgical: no abdominal surgeries  Past Medical History: Past Medical History:  Diagnosis Date   Bipolar 1 disorder (HCC)    COPD (chronic obstructive pulmonary disease) (HCC)    Hepatitis C    HTN (hypertension)    Substance abuse (HCC)     Past Surgical History: Past Surgical History:  Procedure Laterality Date   NO PAST SURGERIES      Family History: Family History  Problem Relation Age of Onset   Hypertension Mother     Social History: Social History   Tobacco Use  Smoking Status Never  Smokeless Tobacco Never   Social History   Substance and Sexual Activity  Alcohol Use Yes   Comment: 4- 30's daily   Social History   Substance and Sexual Activity  Drug Use Yes   Types: Cocaine    Allergies: No  Known Allergies  Medications: Current Outpatient Medications  Medication Sig Dispense Refill   albuterol  (PROVENTIL  HFA;VENTOLIN  HFA) 108 (90 Base) MCG/ACT inhaler Inhale 1-2 puffs into the lungs 3 (three) times daily as needed for wheezing or shortness of breath.     amLODipine (NORVASC) 10 MG tablet Take 10 mg by mouth daily.     ARIPiprazole (ABILIFY) 2 MG tablet Take 4 mg by mouth daily.     Chlorpheniramine Maleate (ALLERGY RELIEF PO) Take by mouth.     gabapentin (NEURONTIN) 300 MG capsule Take 1,200 mg by mouth at bedtime.     losartan (COZAAR) 50 MG tablet Take 75 mg by mouth daily.     QUEtiapine (SEROQUEL) 100 MG tablet Take 100 mg by mouth daily.     No current facility-administered medications for this visit.    Review of Systems: GENERAL: negative for malaise, night sweats HEENT: No changes in hearing or vision, no nose bleeds or other nasal problems. NECK: Negative for lumps, goiter, pain and significant neck swelling RESPIRATORY: Negative for cough, wheezing CARDIOVASCULAR: Negative for chest pain, leg swelling, palpitations, orthopnea GI: SEE HPI MUSCULOSKELETAL: Negative for joint pain or swelling, back pain, and muscle pain. SKIN: Negative for lesions, rash HEMATOLOGY Negative for prolonged bleeding, bruising easily, and swollen nodes. ENDOCRINE: Negative for cold or heat intolerance, polyuria, polydipsia and goiter. NEURO: negative for tremor, gait imbalance, syncope and seizures. The remainder of the review of systems is noncontributory.   Physical Exam: BP (!) 161/95   Pulse 94   Temp (!) 97.3 F (36.3  C)   Ht 5' 2 (1.575 m)   Wt 168 lb 8 oz (76.4 kg)   BMI 30.82 kg/m  GENERAL: The patient is AO x3, in no acute distress. HEENT: Head is normocephalic and atraumatic. EOMI are intact. Mouth is well hydrated and without lesions. NECK: Supple. No masses LUNGS: Clear to auscultation. No presence of rhonchi/wheezing/rales. Adequate chest expansion HEART:  RRR, normal s1 and s2. ABDOMEN: Soft, nontender, no guarding, no peritoneal signs, and nondistended. BS +. No masses.  Imaging/Labs: as above     Latest Ref Rng & Units 11/23/2015    3:17 AM 11/22/2015    7:39 PM 11/19/2015    2:16 PM  CBC  WBC 3.6 - 11.0 K/uL 12.1  11.8  8.9   Hemoglobin 12.0 - 16.0 g/dL 86.0  85.2  84.7   Hematocrit 35.0 - 47.0 % 39.6  43.6  43.2   Platelets 150 - 440 K/uL 224  243  248    No results found for: IRON, TIBC, FERRITIN  I personally reviewed and interpreted the available labs, imaging and endoscopic files.  Impression and Plan:  Laurie Mckay is a 58 y.o. female with bipolar disorder, substance abuse, COPD, hypertension who presents for evaluation of hep C antibody positive, elevated liver enzymes and screening colonoscopy.  #hepatitis C Ab positive #Elevated liver enzymes   Patient has risk factors with previous IVDU  hepatitis C antibody positivity could mean  exposed and immune or chronic hepatitis C , next step would be to check HEP C VIRAL load   Gentotype unknown  Patient is treatment nave and does not appear to have cirrhosis or advanced fibrosis  We discussed pathophysiology of chronic hepatitis C and if left to be treated can lead to evident fibrosis and cirrhosis which can lead to [facility] and complications of portal hypertension.  Patient is interested in pursuing treatment and verbalizes compliance with complete duration of treatment to prevent resistance or treatment.  IF POSITIVE VIRAL LOAD : The patient is a candidate for treatment with DAA for hepatitis C.  Patient is treatment nave and does  have any evidence of advanced liver fibrosis.  We discussed thoroughly the different options available and we will proceed with Mavyret course for 8 weeks.  We will need to check his HCVRNA and CMP once she finishes the treatment course and 3 months after finishing the medication.  We also discussed the importance of medication compliance to  achieve the highest cure rate.  The patient understood and agreed.  Recommendation   -Will check CMP, INR, hepatitis B serology, HIV , recheck hepatitis C RNA level  -Will assess degree of fibrosis with elastography and obtain complete abdominal ultrasound to evaluate for any signs of portal hypertension  -If positive viral load and once above blood work/ultrasound  is done ; Will prescribe glecaprevir 100 mg/pibrentasvir 40 mg per tablet once daily for 8 weeks   -Also counseled for the following ; Avoid sharing toothbrushes and dental or shaving equipment Cover any bleeding wound to prevent others from coming into contact with their blood Not donate blood Encouraged to use barrier protection to prevent sexual transmission  Patient to return in 2 months at which time we will ensure compliance and reorder hep C RNA level to assess treatment response in 12 weeks following completion of Hep C treatment  -If liver enzymes doesn't improve after treatment will explore other reasons for elevated liver enzymes    #Screening colonoscopy  The patient was counseled  regarding the importance of colorectal cancer screening, The benefits of screening include early detection of colorectal cancer and precancerous polyps, which can improve treatment outcomes and reduce mortality. Risks associated with screening, particularly colonoscopy, include potential complications such as bleeding and perforation. After deciding different modalities for screening for colon cancer , patient has opted to pursue Colonoscopy   I thoroughly discussed with the patient the procedure, including the risks involved. Patient understands what the procedure involves including the benefits and any risks. Patient understands alternatives to the proposed procedure. Risks including (but not limited to) bleeding, tearing of the lining (perforation), rupture of adjacent organs, problems with heart and lung function, infection, and  medication reactions. A small percentage of complications may require surgery, hospitalization, repeat endoscopic procedure, and/or transfusion.  Patient understood and agreed.   #HTN The patient was found to have elevated blood pressure when vital signs were checked in the office. The blood pressure was rechecked by the nursing staff and it was found be persistently elevated >140/90 mmHg. I personally advised to the patient to follow up closely with PCP for hypertension control.    All questions were answered.      Laurie Long Faizan Jonte Shiller, MD Gastroenterology and Hepatology [facility] Gastroenterology   This chart has been completed using [facility] Dictation software, and while attempts have been made to ensure accuracy , certain words and phrases may not be transcribed as intended

## 2024-01-02 NOTE — Patient Instructions (Signed)
 It was very nice to meet you today, as dicussed with will plan for the following :  1) labwork and ultrasound  2) Colonoscopy

## 2024-01-05 LAB — COMPREHENSIVE METABOLIC PANEL WITH GFR
ALT: 91 IU/L — ABNORMAL HIGH (ref 0–32)
AST: 124 IU/L — ABNORMAL HIGH (ref 0–40)
Albumin: 4.3 g/dL (ref 3.8–4.9)
Alkaline Phosphatase: 134 IU/L (ref 49–135)
BUN/Creatinine Ratio: 14 (ref 9–23)
BUN: 15 mg/dL (ref 6–24)
Bilirubin Total: 0.8 mg/dL (ref 0.0–1.2)
CO2: 21 mmol/L (ref 20–29)
Calcium: 8.8 mg/dL (ref 8.7–10.2)
Chloride: 100 mmol/L (ref 96–106)
Creatinine, Ser: 1.11 mg/dL — ABNORMAL HIGH (ref 0.57–1.00)
Globulin, Total: 3.7 g/dL (ref 1.5–4.5)
Glucose: 77 mg/dL (ref 70–99)
Potassium: 4 mmol/L (ref 3.5–5.2)
Sodium: 137 mmol/L (ref 134–144)
Total Protein: 8 g/dL (ref 6.0–8.5)
eGFR: 57 mL/min/1.73 — ABNORMAL LOW (ref >59)

## 2024-01-05 LAB — PROTIME-INR
INR: 1.1 (ref 0.9–1.2)
Prothrombin Time: 11.4 s (ref 9.1–12.0)

## 2024-01-05 LAB — CBC
Hematocrit: 43.6 % (ref 34.0–46.6)
Hemoglobin: 14.8 g/dL (ref 11.1–15.9)
MCH: 33 pg (ref 26.6–33.0)
MCHC: 33.9 g/dL (ref 31.5–35.7)
MCV: 97 fL (ref 79–97)
Platelets: 248 x10E3/uL (ref 150–450)
RBC: 4.49 x10E6/uL (ref 3.77–5.28)
RDW: 11.9 % (ref 11.7–15.4)
WBC: 6.6 x10E3/uL (ref 3.4–10.8)

## 2024-01-05 LAB — HEPATITIS B CORE ANTIBODY, TOTAL: Hep B Core Total Ab: POSITIVE — AB

## 2024-01-05 LAB — HEPATITIS B SURFACE ANTIBODY,QUALITATIVE: Hep B Surface Ab, Qual: NONREACTIVE

## 2024-01-05 LAB — HEPATITIS B SURFACE ANTIGEN: Hepatitis B Surface Ag: NEGATIVE

## 2024-01-05 LAB — HCV RNA QUANT
HCV log10: 6.344 {Log_IU}/mL
Hepatitis C Quantitation: 2210000 [IU]/mL

## 2024-01-05 LAB — HIV ANTIBODY (ROUTINE TESTING W REFLEX): HIV Screen 4th Generation wRfx: NONREACTIVE

## 2024-01-05 LAB — HEPATITIS C GENOTYPE

## 2024-01-08 ENCOUNTER — Ambulatory Visit (INDEPENDENT_AMBULATORY_CARE_PROVIDER_SITE_OTHER): Payer: Self-pay | Admitting: Gastroenterology

## 2024-01-09 NOTE — Telephone Encounter (Signed)
 Spoke with patient, scheduled ultrasound for 11/11 at 10:30am. Patient would like a letter sent in the mail.

## 2024-01-16 ENCOUNTER — Encounter (HOSPITAL_COMMUNITY)
Admission: RE | Admit: 2024-01-16 | Discharge: 2024-01-16 | Disposition: A | Discharge status: Home / Self Care | Payer: MEDICAID | Source: Ambulatory Visit | Attending: Gastroenterology | Admitting: Gastroenterology

## 2024-01-16 NOTE — Progress Notes (Addendum)
 Patient called to cancel colonoscopy for 01/18/24.  She had dental work today and is in severe pain.

## 2024-01-16 NOTE — Telephone Encounter (Signed)
 Message sent to endo:  Go ahead and cancel, she says she will call back to reschedule once she is feeling better   RE: cancelled Received: Today Young, Elveria VEAR Levora Randine SHAUNNA, RN; Gaylene Madelin CROME, LPN; Jeanell Graeme RAMAN, CMA I put it in the depot.  Let me know if I need to cancel.       Previous Messages    ----- Message ----- From: Levora Randine SHAUNNA, RN Sent: 01/16/2024   2:18 PM EST To: Madelin CROME Gaylene, LPN; Graeme RAMAN Jeanell, CMA* Subject: cancelled                                      Patient called to cancel colonoscopy for 01/18/24.  She had dental work today ad is in severe pain.

## 2024-01-18 ENCOUNTER — Encounter (HOSPITAL_COMMUNITY): Admission: RE | Payer: Self-pay | Source: Home / Self Care

## 2024-01-18 ENCOUNTER — Ambulatory Visit (HOSPITAL_COMMUNITY): Admission: RE | Admit: 2024-01-18 | Payer: MEDICAID | Source: Home / Self Care | Admitting: Gastroenterology

## 2024-01-18 SURGERY — COLONOSCOPY
Anesthesia: Choice

## 2024-01-23 ENCOUNTER — Ambulatory Visit (HOSPITAL_COMMUNITY)
Admission: RE | Admit: 2024-01-23 | Discharge: 2024-01-23 | Disposition: A | Discharge status: Home / Self Care | Payer: MEDICAID | Source: Ambulatory Visit | Attending: Gastroenterology | Admitting: Gastroenterology

## 2024-01-23 DIAGNOSIS — R7689 Other specified abnormal immunological findings in serum: Secondary | ICD-10-CM | POA: Insufficient documentation

## 2024-01-25 NOTE — Progress Notes (Signed)
 Hi Tanya ,  Can you please call the patient and tell the patient the ultrasound shows possibly fatty liver disease , does show gallbladder stones but does not report cirrhosis. Remind her to come to clinic appointment to discuss treatment for hepatitis C  Thanks,  Wilmetta Speiser Faizan Jeramey Lanuza, MD Gastroenterology and Hepatology [facility] Gastroenterology =================  MPRESSION: ULTRASOUND ABDOMEN:   *Increased hepatic echogenicity, a nonspecific finding that is most commonly seen on the basis of steatosis in the absence of known liver disease. *Cholelithiasis without acute cholecystitis.   ULTRASOUND HEPATIC ELASTOGRAPHY:   Median kPa:  5.1   Diagnostic category: < or = 9 kPa: in the absence of other known clinical signs, rules out cACLD

## 2024-01-30 ENCOUNTER — Ambulatory Visit (INDEPENDENT_AMBULATORY_CARE_PROVIDER_SITE_OTHER): Payer: MEDICAID | Admitting: Gastroenterology

## 2024-02-21 ENCOUNTER — Ambulatory Visit (INDEPENDENT_AMBULATORY_CARE_PROVIDER_SITE_OTHER): Payer: MEDICAID | Admitting: Gastroenterology

## 2024-02-21 ENCOUNTER — Encounter (INDEPENDENT_AMBULATORY_CARE_PROVIDER_SITE_OTHER): Payer: Self-pay | Admitting: Gastroenterology

## 2024-02-21 ENCOUNTER — Telehealth (INDEPENDENT_AMBULATORY_CARE_PROVIDER_SITE_OTHER): Payer: Self-pay | Admitting: Gastroenterology

## 2024-02-21 VITALS — BP 107/69 | HR 102 | Temp 97.1°F | Ht 62.0 in | Wt 172.5 lb

## 2024-02-21 DIAGNOSIS — Z205 Contact with and (suspected) exposure to viral hepatitis: Secondary | ICD-10-CM | POA: Diagnosis not present

## 2024-02-21 DIAGNOSIS — F1090 Alcohol use, unspecified, uncomplicated: Secondary | ICD-10-CM | POA: Diagnosis not present

## 2024-02-21 DIAGNOSIS — F1990 Other psychoactive substance use, unspecified, uncomplicated: Secondary | ICD-10-CM

## 2024-02-21 DIAGNOSIS — Z1211 Encounter for screening for malignant neoplasm of colon: Secondary | ICD-10-CM

## 2024-02-21 DIAGNOSIS — K8020 Calculus of gallbladder without cholecystitis without obstruction: Secondary | ICD-10-CM | POA: Diagnosis not present

## 2024-02-21 DIAGNOSIS — R748 Abnormal levels of other serum enzymes: Secondary | ICD-10-CM | POA: Diagnosis not present

## 2024-02-21 DIAGNOSIS — B182 Chronic viral hepatitis C: Secondary | ICD-10-CM | POA: Insufficient documentation

## 2024-02-21 MED ORDER — MAVYRET 100-40 MG PO TABS: 3.0000 | ORAL_TABLET | Freq: Every day | ORAL | 0 refills | Status: AC

## 2024-02-21 NOTE — Telephone Encounter (Signed)
 We have to send in a form to [facility] for Mavyret but we have to include genotype, viral load, liver fibrosis staging, CBC,CMP,HIV, HBsAB, PT/INR, H&P and pertinent office visit. Pt has most of these labs in.

## 2024-02-21 NOTE — Progress Notes (Signed)
 Cuma Polyakov Faizan Jamil Armwood , M.D. Gastroenterology & Hepatology [facility] Gastroenterology [address] [place], [place] [address] Primary Care Physician: Melvenia Sor, NP [address] [place] [address]  Chief Complaint: Hep C antibody positive, elevated liver enzymes, colon cancer screening   History of Present Illness:  Laurie Mckay is a 58 y.o. female with bipolar disorder, substance abuse, COPD, hypertension who presents for evaluation of hep C antibody positive, elevated liver enzymes and screening colonoscopy.  Patient was scheduled for colonoscopy but she had to cancel because she was getting dental work.  She continues to drink alcohol daily.  Patient has no active complaints at this time.  She is motivated for treatment hepatitis C and seeking help for alcohol cessation  Previous history : Patient reports she has history of IV drug abuse but has been using this for many years now.  She had blood work done at [facility].  She was told blood work positive for hepatitis C.  Drinks alcohol occasionally as per the patient.The patient denies having any nausea, vomiting, fever, chills, hematochezia, melena, hematemesis, abdominal distention, abdominal pain, diarrhea, jaundice, pruritus or weight loss.  Last ZHI:wnwz Last Colonoscopy:none  Labs from 11/2023 AST 87 ALT 82 alk phos 119 T. bili 0.8 Hep C antibody positive, HIV negative  FHx: neg for any gastrointestinal/liver disease, no malignancies Surgical: no abdominal surgeries  Past Medical History: Past Medical History:  Diagnosis Date   Bipolar 1 disorder (HCC)    COPD (chronic obstructive pulmonary disease) (HCC)    Hepatitis C    HTN (hypertension)    Substance abuse (HCC)     Past Surgical History: Past Surgical History:  Procedure Laterality Date   NO PAST SURGERIES      Family History: Family History  Problem Relation Age of Onset   Hypertension Mother     Social  History: Social History   Tobacco Use  Smoking Status Never  Smokeless Tobacco Never   Social History   Substance and Sexual Activity  Alcohol Use Yes   Comment: 4- 30's daily   Social History   Substance and Sexual Activity  Drug Use Yes   Types: Cocaine    Allergies: No Known Allergies  Medications: Current Outpatient Medications  Medication Sig Dispense Refill   albuterol  (PROVENTIL  HFA;VENTOLIN  HFA) 108 (90 Base) MCG/ACT inhaler Inhale 1-2 puffs into the lungs 3 (three) times daily as needed for wheezing or shortness of breath.     amLODipine (NORVASC) 10 MG tablet Take 10 mg by mouth daily.     ARIPiprazole (ABILIFY) 2 MG tablet Take 4 mg by mouth daily.     Chlorpheniramine Maleate (ALLERGY RELIEF PO) Take by mouth.     gabapentin (NEURONTIN) 300 MG capsule Take 1,200 mg by mouth at bedtime.     losartan (COZAAR) 50 MG tablet Take 75 mg by mouth daily.     QUEtiapine (SEROQUEL) 100 MG tablet Take 100 mg by mouth daily.     No current facility-administered medications for this visit.    Review of Systems: GENERAL: negative for malaise, night sweats HEENT: No changes in hearing or vision, no nose bleeds or other nasal problems. NECK: Negative for lumps, goiter, pain and significant neck swelling RESPIRATORY: Negative for cough, wheezing CARDIOVASCULAR: Negative for chest pain, leg swelling, palpitations, orthopnea GI: SEE HPI MUSCULOSKELETAL: Negative for joint pain or swelling, back pain, and muscle pain. SKIN: Negative for lesions, rash HEMATOLOGY Negative for prolonged bleeding, bruising easily,  and swollen nodes. ENDOCRINE: Negative for cold or heat intolerance, polyuria, polydipsia and goiter. NEURO: negative for tremor, gait imbalance, syncope and seizures. The remainder of the review of systems is noncontributory.   Physical Exam: There were no vitals taken for this visit. GENERAL: The patient is AO x3, in no acute distress. HEENT: Head is  normocephalic and atraumatic. EOMI are intact. Mouth is well hydrated and without lesions. NECK: Supple. No masses LUNGS: Clear to auscultation. No presence of rhonchi/wheezing/rales. Adequate chest expansion HEART: RRR, normal s1 and s2. ABDOMEN: Soft, nontender, no guarding, no peritoneal signs, and nondistended. BS +. No masses.  Imaging/Labs: as above     Latest Ref Rng & Units 01/02/2024   11:25 AM 11/23/2015    3:17 AM 11/22/2015    7:39 PM  CBC  WBC 3.4 - 10.8 x10E3/uL 6.6  12.1  11.8   Hemoglobin 11.1 - 15.9 g/dL 85.1  86.0  85.2   Hematocrit 34.0 - 46.6 % 43.6  39.6  43.6   Platelets 150 - 450 x10E3/uL 248  224  243    No results found for: IRON, TIBC, FERRITIN  I personally reviewed and interpreted the available labs, imaging and endoscopic files.  Workup for Hep C and elevated liver enzymes   Hep B core antibody positive Hep B surface antigen negative Hep B surface antibody negative   Hep C RNA positive : 2210000 Hep C genotype : 1a   HIV negative    INR: 1.1  Elevated liver enzymes AST 124 ALT 91 (AST>ALT)      CBC HBG : 14.8 PLT:248   MPRESSION: ULTRASOUND ABDOMEN:   *Increased hepatic echogenicity, a nonspecific finding that is most commonly seen on the basis of steatosis in the absence of known liver disease. *Cholelithiasis without acute cholecystitis.   ULTRASOUND HEPATIC ELASTOGRAPHY:   Median kPa:  5.1   Diagnostic category: < or = 9 kPa: in the absence of other known clinical signs, rules out cACLD  Impression and Plan:  Laurie Mckay is a 58 y.o. female with bipolar disorder, substance abuse, COPD, hypertension who presents for evaluation of hep C antibody positive, elevated liver enzymes and screening colonoscopy.  # Hepatitis C #Elevated liver enzymes #Hep B exposed   Patient has risk factors with previous IVDU   Hep B core antibody positive Hep B surface antigen negative Hep B surface antibody negative   Hep C RNA  positive : 2210000 Hep C genotype : 1a HIV negative   Patient is treatment nave and does not appear to have cirrhosis or advanced fibrosis  We discussed pathophysiology of chronic hepatitis C and if left to be treated can lead to evident fibrosis and cirrhosis which can lead to [facility] and complications of portal hypertension.  Patient is interested in pursuing treatment and verbalizes compliance with complete duration of treatment to prevent resistance or treatment.  The patient is a candidate for treatment with DAA for hepatitis C.  Patient is treatment nave and does  have any evidence of advanced liver fibrosis.  We discussed thoroughly the different options available and we will proceed with Mavyret course for 8 weeks.  We will need to check his HCVRNA and CMP once she finishes the treatment course and 3 months after finishing the medication.  We also discussed the importance of medication compliance to achieve the highest cure rate.  The patient understood and agreed.  Resolved HBV infection (HBcAb positive, HBsAg negative) with chronic active Hep C   Evidence  of prior HBV infection should be monitored for HBV reactivation during and after DAA therapy, as recommended by the Infectious Diseases Society of [employer] (check Hep B sAg and liver enzymes 4 weeks into the treatment)   Recommendation   - Will prescribe glecaprevir 100 mg/pibrentasvir 40 mg per tablet once daily x3 for 8 weeks  -check Hep B sAg and liver enzymes 4 weeks into the treatment  -Also counseled for the following ; Avoid sharing toothbrushes and dental or shaving equipment Cover any bleeding wound to prevent others from coming into contact with their blood Not donate blood Encouraged to use barrier protection to prevent sexual transmission  Patient to return in 2 months at which time we will ensure compliance and reorder hep C RNA level to assess treatment response in 12 weeks following completion of Hep C  treatment  -check Hep B sAg and liver enzymes 4 weeks into the treatment   -AST>ALT likely in setting of ETOH use; complete alcohol cessation ' continue with gabapentin and information for [facility] given (addiction medicine)  -If liver enzymes doesn't improve after treatment will explore other reasons for elevated liver enzymes    #Screening colonoscopy  The patient was counseled regarding the importance of colorectal cancer screening, The benefits of screening include early detection of colorectal cancer and precancerous polyps, which can improve treatment outcomes and reduce mortality. Risks associated with screening, particularly colonoscopy, include potential complications such as bleeding and perforation. After deciding different modalities for screening for colon cancer , patient has opted to pursue Colonoscopy   I thoroughly discussed with the patient the procedure, including the risks involved. Patient understands what the procedure involves including the benefits and any risks. Patient understands alternatives to the proposed procedure. Risks including (but not limited to) bleeding, tearing of the lining (perforation), rupture of adjacent organs, problems with heart and lung function, infection, and medication reactions. A small percentage of complications may require surgery, hospitalization, repeat endoscopic procedure, and/or transfusion.  Patient understood and agreed.   All questions were answered.      Marisabel Macpherson Faizan Sintia Mckissic, MD Gastroenterology and Hepatology [facility] Gastroenterology   This chart has been completed using [facility] Dictation software, and while attempts have been made to ensure accuracy , certain words and phrases may not be transcribed as intended

## 2024-02-21 NOTE — Patient Instructions (Signed)
 It was very nice to meet you today, as dicussed with will plan for the following : 1) do labwork 4 weeks after starting medication   2) colonoscopy

## 2024-02-27 NOTE — Telephone Encounter (Signed)
 Pt will come by office to sign [facility] Form

## 2024-02-28 NOTE — Telephone Encounter (Signed)
 Fax received from [facility] stating they have initiated a PA for Mavyret  Tab 100-40 mg. CMM [id number]  Please sent clinical notes so we can submit this PA on your behalf Clinical notes, lab results and ultrasound faxed to [phone].

## 2024-03-04 ENCOUNTER — Telehealth: Payer: Self-pay | Admitting: *Deleted

## 2024-03-04 NOTE — Telephone Encounter (Signed)
 Called pt to schedule colonoscopy with Dr. Cinderella, any room. She stated she is just not ready to schedule this right now. She said when she was ready to commit to having done, she would call us . FYI

## 2024-03-12 NOTE — Telephone Encounter (Signed)
 Contacted [facility] at [phone] to get status update of PA that was initiated by [facility]-the representative I spoke with states they can not find that patient. Attempted to contact [facility], unable to find patient. I have faxed the [facility] Hep C Enrollment Form and all pertinent clinical information to [phone]

## 2024-03-12 NOTE — Telephone Encounter (Signed)
 Patient came by and signed [employer] Enrollment Form

## 2024-03-19 NOTE — Telephone Encounter (Signed)
 Faxed signed [facility] form back (this was the form we received stating they have initiated a PA for Mavyret  Tab 100-40 mg. Looks like it needed a provider signature and I overlooked it. Provider signed and form was faxed. Received confirmation that form was received.

## 2024-03-22 ENCOUNTER — Encounter (INDEPENDENT_AMBULATORY_CARE_PROVIDER_SITE_OTHER): Payer: Self-pay

## 2024-03-22 NOTE — Telephone Encounter (Signed)
 Contacted [facility] and spoke to [place]. The order is currently on hold but she was able to pull up profile. Nichole also updated the script to reflect the correct days needed. Originally they were saying it was 28 day course and I advised it need to be 3 tablets daily for 8 weeks. There is a PA issue with conflicting notes on Pharmacy side. Claim has been rejected due cost exceeding max allowable (which is a plan limitation). Then the notes stated med does not need PA. The pharmacy reached out to plan to follow up on override that is needed. Key was cancelled for Cover My Meds due to no PA needed. Plan is stating that need an override for cost amount. Contacted the Pharmacy and spoke to [facility]. Deasia contacted Plan while I was on the phone and the Plan states they need the doctor or patient to call to give them the verbal ok to speak with us . Deasia states that if the patient or doctor will call the plan then the pharmacy will reach out them and give them the override. Attempted to contact patient to have her call [facility] at [phone] but her phone is not working at this time. I will mail a letter to her.

## 2024-03-29 NOTE — Telephone Encounter (Signed)
Attempted to contact patient but phone number is no longer in service
